# Patient Record
Sex: Male | Born: 1960
Health system: Southern US, Community
[De-identification: ages and names within clinical notes are randomized; demographics above are authoritative.]

## PROBLEM LIST (undated history)

## (undated) DIAGNOSIS — N189 Chronic kidney disease, unspecified: Secondary | ICD-10-CM

## (undated) DIAGNOSIS — E119 Type 2 diabetes mellitus without complications: Secondary | ICD-10-CM

## (undated) DIAGNOSIS — I10 Essential (primary) hypertension: Secondary | ICD-10-CM

## (undated) HISTORY — PX: NO PAST SURGERIES: SHX2092

## (undated) HISTORY — PX: HERNIA REPAIR: SHX51

---

## 2005-12-15 ENCOUNTER — Ambulatory Visit: Payer: Self-pay | Admitting: Internal Medicine

## 2013-01-06 ENCOUNTER — Other Ambulatory Visit: Payer: Self-pay | Admitting: Internal Medicine

## 2013-01-06 DIAGNOSIS — R894 Abnormal immunological findings in specimens from other organs, systems and tissues: Secondary | ICD-10-CM

## 2013-01-06 DIAGNOSIS — R519 Headache, unspecified: Secondary | ICD-10-CM

## 2013-01-06 DIAGNOSIS — E236 Other disorders of pituitary gland: Secondary | ICD-10-CM

## 2013-01-12 ENCOUNTER — Ambulatory Visit
Admission: RE | Admit: 2013-01-12 | Discharge: 2013-01-12 | Disposition: A | Discharge status: Home / Self Care | Payer: BC Managed Care – PPO | Source: Ambulatory Visit | Attending: Internal Medicine | Admitting: Internal Medicine

## 2013-01-12 DIAGNOSIS — R519 Headache, unspecified: Secondary | ICD-10-CM

## 2013-01-12 DIAGNOSIS — E236 Other disorders of pituitary gland: Secondary | ICD-10-CM

## 2013-01-12 DIAGNOSIS — R894 Abnormal immunological findings in specimens from other organs, systems and tissues: Secondary | ICD-10-CM

## 2013-01-12 MED ORDER — GADOBENATE DIMEGLUMINE 529 MG/ML IV SOLN
15.0000 mL | Freq: Once | INTRAVENOUS | Status: AC | PRN
  Administered 2013-01-12: 15 mL via INTRAVENOUS

## 2015-05-17 ENCOUNTER — Other Ambulatory Visit: Payer: Self-pay | Admitting: Gastroenterology

## 2015-06-09 ENCOUNTER — Other Ambulatory Visit (HOSPITAL_COMMUNITY): Payer: Self-pay | Admitting: Internal Medicine

## 2015-06-09 DIAGNOSIS — R0602 Shortness of breath: Secondary | ICD-10-CM

## 2015-06-10 ENCOUNTER — Ambulatory Visit (HOSPITAL_COMMUNITY): Payer: No Typology Code available for payment source | Attending: Cardiovascular Disease

## 2015-06-10 ENCOUNTER — Other Ambulatory Visit: Payer: Self-pay

## 2015-06-10 DIAGNOSIS — R0602 Shortness of breath: Secondary | ICD-10-CM

## 2015-06-10 DIAGNOSIS — E785 Hyperlipidemia, unspecified: Secondary | ICD-10-CM | POA: Diagnosis not present

## 2015-06-10 DIAGNOSIS — I119 Hypertensive heart disease without heart failure: Secondary | ICD-10-CM | POA: Insufficient documentation

## 2015-06-10 DIAGNOSIS — Z8249 Family history of ischemic heart disease and other diseases of the circulatory system: Secondary | ICD-10-CM | POA: Diagnosis not present

## 2015-06-16 ENCOUNTER — Other Ambulatory Visit: Payer: Self-pay | Admitting: Nephrology

## 2015-06-16 DIAGNOSIS — R809 Proteinuria, unspecified: Secondary | ICD-10-CM

## 2015-06-17 ENCOUNTER — Other Ambulatory Visit (HOSPITAL_COMMUNITY): Payer: Self-pay | Admitting: Nephrology

## 2015-06-17 DIAGNOSIS — R809 Proteinuria, unspecified: Secondary | ICD-10-CM

## 2015-06-21 ENCOUNTER — Ambulatory Visit (HOSPITAL_COMMUNITY): Admission: RE | Admit: 2015-06-21 | Payer: Self-pay | Source: Ambulatory Visit | Admitting: Gastroenterology

## 2015-06-21 ENCOUNTER — Ambulatory Visit
Admission: RE | Admit: 2015-06-21 | Discharge: 2015-06-21 | Disposition: A | Discharge status: Home / Self Care | Payer: No Typology Code available for payment source | Source: Ambulatory Visit | Attending: Nephrology | Admitting: Nephrology

## 2015-06-21 ENCOUNTER — Encounter (HOSPITAL_COMMUNITY): Admission: RE | Payer: Self-pay | Source: Ambulatory Visit

## 2015-06-21 DIAGNOSIS — R809 Proteinuria, unspecified: Secondary | ICD-10-CM

## 2015-06-21 SURGERY — COLONOSCOPY WITH PROPOFOL
Anesthesia: Monitor Anesthesia Care

## 2015-06-25 ENCOUNTER — Other Ambulatory Visit: Payer: Self-pay | Admitting: Radiology

## 2015-06-25 ENCOUNTER — Other Ambulatory Visit: Payer: Self-pay | Admitting: General Surgery

## 2015-06-28 ENCOUNTER — Ambulatory Visit (HOSPITAL_COMMUNITY)
Admission: RE | Admit: 2015-06-28 | Discharge: 2015-06-28 | Disposition: A | Discharge status: Home / Self Care | Payer: No Typology Code available for payment source | Source: Ambulatory Visit | Attending: Nephrology | Admitting: Nephrology

## 2015-06-28 ENCOUNTER — Encounter (HOSPITAL_COMMUNITY): Payer: Self-pay

## 2015-06-28 DIAGNOSIS — R809 Proteinuria, unspecified: Secondary | ICD-10-CM | POA: Diagnosis not present

## 2015-06-28 HISTORY — DX: Chronic kidney disease, unspecified: N18.9

## 2015-06-28 HISTORY — DX: Essential (primary) hypertension: I10

## 2015-06-28 LAB — CBC
HEMATOCRIT: 44.3 % (ref 39.0–52.0)
HEMOGLOBIN: 15.4 g/dL (ref 13.0–17.0)
MCH: 31.8 pg (ref 26.0–34.0)
MCHC: 34.8 g/dL (ref 30.0–36.0)
MCV: 91.5 fL (ref 78.0–100.0)
Platelets: 198 10*3/uL (ref 150–400)
RBC: 4.84 MIL/uL (ref 4.22–5.81)
RDW: 13.1 % (ref 11.5–15.5)
WBC: 5.1 10*3/uL (ref 4.0–10.5)

## 2015-06-28 LAB — APTT: aPTT: 29 seconds (ref 24–37)

## 2015-06-28 LAB — PROTIME-INR
INR: 0.96 (ref 0.00–1.49)
Prothrombin Time: 13 seconds (ref 11.6–15.2)

## 2015-06-28 LAB — GLUCOSE, CAPILLARY: Glucose-Capillary: 115 mg/dL — ABNORMAL HIGH (ref 65–99)

## 2015-06-28 MED ORDER — FENTANYL CITRATE (PF) 100 MCG/2ML IJ SOLN
INTRAMUSCULAR | Status: AC | PRN
  Administered 2015-06-28: 25 ug via INTRAVENOUS
  Administered 2015-06-28: 50 ug via INTRAVENOUS

## 2015-06-28 MED ORDER — MIDAZOLAM HCL 2 MG/2ML IJ SOLN
INTRAMUSCULAR | Status: AC | PRN
  Administered 2015-06-28: 0.5 mg via INTRAVENOUS
  Administered 2015-06-28: 1 mg via INTRAVENOUS

## 2015-06-28 MED ORDER — LIDOCAINE HCL (PF) 1 % IJ SOLN
INTRAMUSCULAR | Status: AC
  Filled 2015-06-28: qty 10

## 2015-06-28 MED ORDER — SODIUM CHLORIDE 0.9 % IV SOLN
INTRAVENOUS | Status: AC | PRN
  Administered 2015-06-28: 10 mL/h via INTRAVENOUS

## 2015-06-28 MED ORDER — MIDAZOLAM HCL 2 MG/2ML IJ SOLN
INTRAMUSCULAR | Status: AC
  Filled 2015-06-28: qty 2

## 2015-06-28 MED ORDER — GELATIN ABSORBABLE 12-7 MM EX MISC
CUTANEOUS | Status: AC
  Filled 2015-06-28: qty 1

## 2015-06-28 MED ORDER — FENTANYL CITRATE (PF) 100 MCG/2ML IJ SOLN
INTRAMUSCULAR | Status: AC
  Filled 2015-06-28: qty 2

## 2015-06-28 MED ORDER — SODIUM CHLORIDE 0.9 % IV SOLN: Freq: Once | INTRAVENOUS | Status: DC

## 2015-06-28 NOTE — Procedures (Signed)
sucessful LT RENAL RANDOM CORE BX No comp Stable path pending Full report in PACS

## 2015-06-28 NOTE — Discharge Instructions (Signed)
Kidney Biopsy, Care After °Refer to this sheet in the next few weeks. These instructions provide you with information on caring for yourself after your procedure. Your health care provider may also give you more specific instructions. Your treatment has been planned according to current medical practices, but problems sometimes occur. Call your health care provider if you have any problems or questions after your procedure.  °WHAT TO EXPECT AFTER THE PROCEDURE  °· You may notice blood in the urine for the first 24 hours after the biopsy. °· You may feel some pain at the biopsy site for 1-2 weeks after the biopsy. °HOME CARE INSTRUCTIONS °· Do not lift anything heavier than 10 lb (4.5 kg) for 2 weeks. °· Do not take any non-steroidal anti-inflammatory drugs (NSAIDs) or any blood thinners for a week after the biopsy unless instructed to do so by your health care provider. °· Only take medicines for pain, fever, or discomfort as directed by your health care provider. °SEEK MEDICAL CARE IF: °· You have bloody urine more than 24 hours after the biopsy.   °· You develop a fever.   °· You cannot urinate.   °· You have increasing pain at the biopsy site.   °SEEK IMMEDIATE MEDICAL CARE IF: °You feel faint or dizzy.  °  °This information is not intended to replace advice given to you by your health care provider. Make sure you discuss any questions you have with your health care provider. °  °Document Released: 10/30/2012 Document Reviewed: 10/30/2012 °Elsevier Interactive Patient Education ©2016 [employer] ° °

## 2015-06-28 NOTE — H&P (Signed)
Chief Complaint: proteinuria and CKD  Referring Physician:Dr. Zetta BillsJay Patel  Supervising Physician: Ruel FavorsShick, Trevor  HPI: Greg Johnston is an 55 y.o. male who for the last month has had significant fluid retention.  He was noted to have CKD with proteinuria.  He was having difficulty urinating without the help of lasix.  His albumin has been noted to be low at 1.6 as well.  A request has been made for a random renal biopsy to help determine a cause for this problem.  He presents today for this procedure.  Past Medical History:  Past Medical History  Diagnosis Date  . Hypertension   . Chronic kidney disease     Past Surgical History:  Past Surgical History  Procedure Laterality Date  . No past surgeries      Family History:  Family History  Problem Relation Age of Onset  . Atrial fibrillation Mother   . Congestive Heart Failure Mother   . Hypertension Mother   . Melanoma Father   . Hypertension Father   . Diabetes Father   . Testicular cancer Brother     Social History:  reports that he quit smoking about 22 years ago. His smoking use included Cigarettes. He has a 30 pack-year smoking history. He does not have any smokeless tobacco history on file. He reports that he does not drink alcohol or use illicit drugs.  Allergies: No Known Allergies  Medications:   Medication List    ASK your doctor about these medications        diazepam 5 MG tablet  Commonly known as:  VALIUM  Take one tablet nightly as needed for anxiety or sleep.     furosemide 40 MG tablet  Commonly known as:  LASIX  Take 40 mg by mouth 2 (two) times daily.     indomethacin 50 MG capsule  Commonly known as:  INDOCIN  Take one capsule by mouth twice daily as needeed for gout.     ketoconazole 2 % shampoo  Commonly known as:  NIZORAL  APP EXT 2 TIMES A WEEK.     lisinopril 10 MG tablet  Commonly known as:  PRINIVIL,ZESTRIL  Take 20 mg by mouth at bedtime.     polyethylene glycol-electrolytes 420 g  solution  Commonly known as:  NuLYTELY/GoLYTELY  TK UTD PER SHEET INSTRUCTIONS FOR 1 DAY     testosterone 50 MG/5GM (1%) Gel  Commonly known as:  ANDROGEL  APP 2 APPLICATIONS EXTERNALLY TO AFFECTED  AREA EVERY TWO DAYS.        Please HPI for pertinent positives, otherwise complete 10 system ROS negative.  Mallampati Score: MD Evaluation Airway: WNL Heart: WNL Abdomen: WNL Chest/ Lungs: WNL ASA  Classification: 2 Mallampati/Airway Score: One  Physical Exam: BP 122/87 mmHg  Pulse 66  Temp(Src) 98.1 F (36.7 C) (Oral)  Resp 20  Ht 6\' 2"  (1.88 m)  Wt 260 lb (117.935 kg)  BMI 33.37 kg/m2  SpO2 93% Body mass index is 33.37 kg/(m^2). General: pleasant, WD, WN white male who is laying in bed in NAD HEENT: head is normocephalic, atraumatic.  Sclera are noninjected.  PERRL.  Ears and nose without any masses or lesions.  Mouth is pink and moist Heart: regular, rate, and rhythm.  Normal s1,s2. No obvious murmurs, gallops, or rubs noted.  Palpable radial and pedal pulses bilaterally Lungs: CTAB, no wheezes, rhonchi, or rales noted.  Respiratory effort nonlabored Abd: soft, NT, ND, +BS, no masses, hernias, or organomegaly MS: all  4 extremities are symmetrical with no cyanosis, clubbing.  +bilateral LE edema (+1) Skin: warm and dry with no masses, lesions, or rashes Psych: A&Ox3 with an appropriate affect.   Labs: Results for orders placed or performed during the hospital encounter of 06/28/15 (from the past 48 hour(s))  APTT upon arrival     Status: None   Collection Time: 06/28/15  6:30 AM  Result Value Ref Range   aPTT 29 24 - 37 seconds  CBC upon arrival     Status: None   Collection Time: 06/28/15  6:30 AM  Result Value Ref Range   WBC 5.1 4.0 - 10.5 K/uL   RBC 4.84 4.22 - 5.81 MIL/uL   Hemoglobin 15.4 13.0 - 17.0 g/dL   HCT 40.9 81.1 - 91.4 %   MCV 91.5 78.0 - 100.0 fL   MCH 31.8 26.0 - 34.0 pg   MCHC 34.8 30.0 - 36.0 g/dL   RDW 78.2 95.6 - 21.3 %   Platelets 198 150  - 400 K/uL  Protime-INR upon arrival     Status: None   Collection Time: 06/28/15  6:30 AM  Result Value Ref Range   Prothrombin Time 13.0 11.6 - 15.2 seconds   INR 0.96 0.00 - 1.49  Glucose, capillary     Status: Abnormal   Collection Time: 06/28/15  6:38 AM  Result Value Ref Range   Glucose-Capillary 115 (H) 65 - 99 mg/dL    Imaging: No results found.  Assessment/Plan 1. Proteinuria with CKD -labs and vitals have been reviewed and are normal -will proceed with random renal biopsy -Risks and Benefits discussed with the patient including, but not limited to bleeding, infection, damage to adjacent structures or low yield requiring additional tests. All of the patient's questions were answered, patient is agreeable to proceed. Consent signed and in chart.   Thank you for this interesting consult.  I greatly enjoyed meeting Greg Johnston and look forward to participating in their care.  A copy of this report was sent to the requesting provider on this date.  Electronically Signed: Letha Cape 06/28/2015, 7:54 AM   I spent a total of  30 Minutes   in face to face in clinical consultation, greater than 50% of which was counseling/coordinating care for proteinuria

## 2015-07-06 ENCOUNTER — Other Ambulatory Visit: Payer: Self-pay | Admitting: Nephrology

## 2015-07-06 ENCOUNTER — Ambulatory Visit
Admission: RE | Admit: 2015-07-06 | Discharge: 2015-07-06 | Disposition: A | Discharge status: Home / Self Care | Payer: No Typology Code available for payment source | Source: Ambulatory Visit | Attending: Nephrology | Admitting: Nephrology

## 2015-07-06 DIAGNOSIS — R918 Other nonspecific abnormal finding of lung field: Secondary | ICD-10-CM

## 2015-09-03 ENCOUNTER — Other Ambulatory Visit: Payer: Self-pay | Admitting: Nephrology

## 2015-09-03 ENCOUNTER — Ambulatory Visit
Admission: RE | Admit: 2015-09-03 | Discharge: 2015-09-03 | Disposition: A | Discharge status: Home / Self Care | Payer: No Typology Code available for payment source | Source: Ambulatory Visit | Attending: Nephrology | Admitting: Nephrology

## 2015-09-03 DIAGNOSIS — N052 Unspecified nephritic syndrome with diffuse membranous glomerulonephritis: Secondary | ICD-10-CM

## 2015-09-10 ENCOUNTER — Encounter (INDEPENDENT_AMBULATORY_CARE_PROVIDER_SITE_OTHER): Payer: No Typology Code available for payment source | Admitting: Ophthalmology

## 2015-09-10 DIAGNOSIS — I10 Essential (primary) hypertension: Secondary | ICD-10-CM | POA: Diagnosis not present

## 2015-09-10 DIAGNOSIS — H35033 Hypertensive retinopathy, bilateral: Secondary | ICD-10-CM

## 2015-09-10 DIAGNOSIS — H2513 Age-related nuclear cataract, bilateral: Secondary | ICD-10-CM | POA: Diagnosis not present

## 2015-09-10 DIAGNOSIS — H43813 Vitreous degeneration, bilateral: Secondary | ICD-10-CM

## 2015-10-12 ENCOUNTER — Other Ambulatory Visit: Payer: Self-pay | Admitting: Nephrology

## 2015-10-12 ENCOUNTER — Ambulatory Visit
Admission: RE | Admit: 2015-10-12 | Discharge: 2015-10-12 | Disposition: A | Discharge status: Home / Self Care | Payer: No Typology Code available for payment source | Source: Ambulatory Visit | Attending: Nephrology | Admitting: Nephrology

## 2015-10-12 DIAGNOSIS — R109 Unspecified abdominal pain: Secondary | ICD-10-CM

## 2015-10-14 ENCOUNTER — Other Ambulatory Visit: Payer: Self-pay | Admitting: Nurse Practitioner

## 2015-10-14 ENCOUNTER — Ambulatory Visit
Admission: RE | Admit: 2015-10-14 | Discharge: 2015-10-14 | Disposition: A | Discharge status: Home / Self Care | Payer: No Typology Code available for payment source | Source: Ambulatory Visit | Attending: Nurse Practitioner | Admitting: Nurse Practitioner

## 2015-10-14 DIAGNOSIS — M25552 Pain in left hip: Secondary | ICD-10-CM

## 2015-11-10 ENCOUNTER — Encounter (INDEPENDENT_AMBULATORY_CARE_PROVIDER_SITE_OTHER): Payer: No Typology Code available for payment source | Admitting: Ophthalmology

## 2015-11-10 DIAGNOSIS — H35033 Hypertensive retinopathy, bilateral: Secondary | ICD-10-CM

## 2015-11-10 DIAGNOSIS — H2513 Age-related nuclear cataract, bilateral: Secondary | ICD-10-CM | POA: Diagnosis not present

## 2015-11-10 DIAGNOSIS — H43813 Vitreous degeneration, bilateral: Secondary | ICD-10-CM

## 2015-11-10 DIAGNOSIS — I10 Essential (primary) hypertension: Secondary | ICD-10-CM

## 2016-05-15 DIAGNOSIS — E291 Testicular hypofunction: Secondary | ICD-10-CM | POA: Diagnosis not present

## 2016-05-26 DIAGNOSIS — N052 Unspecified nephritic syndrome with diffuse membranous glomerulonephritis: Secondary | ICD-10-CM | POA: Diagnosis not present

## 2016-05-26 DIAGNOSIS — E559 Vitamin D deficiency, unspecified: Secondary | ICD-10-CM | POA: Diagnosis not present

## 2016-05-26 DIAGNOSIS — Z79899 Other long term (current) drug therapy: Secondary | ICD-10-CM | POA: Diagnosis not present

## 2016-05-30 DIAGNOSIS — E669 Obesity, unspecified: Secondary | ICD-10-CM | POA: Diagnosis not present

## 2016-05-30 DIAGNOSIS — I129 Hypertensive chronic kidney disease with stage 1 through stage 4 chronic kidney disease, or unspecified chronic kidney disease: Secondary | ICD-10-CM | POA: Diagnosis not present

## 2016-05-30 DIAGNOSIS — N052 Unspecified nephritic syndrome with diffuse membranous glomerulonephritis: Secondary | ICD-10-CM | POA: Diagnosis not present

## 2016-05-30 DIAGNOSIS — E291 Testicular hypofunction: Secondary | ICD-10-CM | POA: Diagnosis not present

## 2016-05-30 DIAGNOSIS — E559 Vitamin D deficiency, unspecified: Secondary | ICD-10-CM | POA: Diagnosis not present

## 2016-06-13 DIAGNOSIS — E291 Testicular hypofunction: Secondary | ICD-10-CM | POA: Diagnosis not present

## 2016-06-14 ENCOUNTER — Other Ambulatory Visit: Payer: Self-pay | Admitting: Internal Medicine

## 2016-06-14 ENCOUNTER — Ambulatory Visit
Admission: RE | Admit: 2016-06-14 | Discharge: 2016-06-14 | Disposition: A | Discharge status: Home / Self Care | Payer: BLUE CROSS/BLUE SHIELD | Source: Ambulatory Visit | Attending: Internal Medicine | Admitting: Internal Medicine

## 2016-06-14 DIAGNOSIS — R05 Cough: Secondary | ICD-10-CM | POA: Diagnosis not present

## 2016-06-14 DIAGNOSIS — Z9225 Personal history of immunosupression therapy: Secondary | ICD-10-CM | POA: Diagnosis not present

## 2016-06-14 DIAGNOSIS — J019 Acute sinusitis, unspecified: Secondary | ICD-10-CM | POA: Diagnosis not present

## 2016-06-14 DIAGNOSIS — J209 Acute bronchitis, unspecified: Secondary | ICD-10-CM | POA: Diagnosis not present

## 2016-07-03 DIAGNOSIS — E291 Testicular hypofunction: Secondary | ICD-10-CM | POA: Diagnosis not present

## 2016-07-11 DIAGNOSIS — E291 Testicular hypofunction: Secondary | ICD-10-CM | POA: Diagnosis not present

## 2016-07-11 DIAGNOSIS — Z1389 Encounter for screening for other disorder: Secondary | ICD-10-CM | POA: Diagnosis not present

## 2016-07-11 DIAGNOSIS — M109 Gout, unspecified: Secondary | ICD-10-CM | POA: Diagnosis not present

## 2016-07-11 DIAGNOSIS — E119 Type 2 diabetes mellitus without complications: Secondary | ICD-10-CM | POA: Diagnosis not present

## 2016-07-11 DIAGNOSIS — Z0000 Encounter for general adult medical examination without abnormal findings: Secondary | ICD-10-CM | POA: Diagnosis not present

## 2016-07-18 DIAGNOSIS — E291 Testicular hypofunction: Secondary | ICD-10-CM | POA: Diagnosis not present

## 2016-08-01 DIAGNOSIS — E291 Testicular hypofunction: Secondary | ICD-10-CM | POA: Diagnosis not present

## 2016-08-16 DIAGNOSIS — E291 Testicular hypofunction: Secondary | ICD-10-CM | POA: Diagnosis not present

## 2016-09-08 DIAGNOSIS — E291 Testicular hypofunction: Secondary | ICD-10-CM | POA: Diagnosis not present

## 2016-09-12 DIAGNOSIS — N052 Unspecified nephritic syndrome with diffuse membranous glomerulonephritis: Secondary | ICD-10-CM | POA: Diagnosis not present

## 2016-09-12 DIAGNOSIS — E559 Vitamin D deficiency, unspecified: Secondary | ICD-10-CM | POA: Diagnosis not present

## 2016-09-19 DIAGNOSIS — E1129 Type 2 diabetes mellitus with other diabetic kidney complication: Secondary | ICD-10-CM | POA: Diagnosis not present

## 2016-09-19 DIAGNOSIS — N052 Unspecified nephritic syndrome with diffuse membranous glomerulonephritis: Secondary | ICD-10-CM | POA: Diagnosis not present

## 2016-09-19 DIAGNOSIS — I129 Hypertensive chronic kidney disease with stage 1 through stage 4 chronic kidney disease, or unspecified chronic kidney disease: Secondary | ICD-10-CM | POA: Diagnosis not present

## 2016-09-19 DIAGNOSIS — E559 Vitamin D deficiency, unspecified: Secondary | ICD-10-CM | POA: Diagnosis not present

## 2016-09-25 DIAGNOSIS — E291 Testicular hypofunction: Secondary | ICD-10-CM | POA: Diagnosis not present

## 2016-10-09 DIAGNOSIS — E291 Testicular hypofunction: Secondary | ICD-10-CM | POA: Diagnosis not present

## 2016-10-20 DIAGNOSIS — I10 Essential (primary) hypertension: Secondary | ICD-10-CM | POA: Diagnosis not present

## 2016-10-20 DIAGNOSIS — E291 Testicular hypofunction: Secondary | ICD-10-CM | POA: Diagnosis not present

## 2016-10-20 DIAGNOSIS — E1165 Type 2 diabetes mellitus with hyperglycemia: Secondary | ICD-10-CM | POA: Diagnosis not present

## 2016-10-20 DIAGNOSIS — E119 Type 2 diabetes mellitus without complications: Secondary | ICD-10-CM | POA: Diagnosis not present

## 2016-11-07 DIAGNOSIS — E291 Testicular hypofunction: Secondary | ICD-10-CM | POA: Diagnosis not present

## 2016-11-22 DIAGNOSIS — E291 Testicular hypofunction: Secondary | ICD-10-CM | POA: Diagnosis not present

## 2016-11-27 DIAGNOSIS — E119 Type 2 diabetes mellitus without complications: Secondary | ICD-10-CM | POA: Diagnosis not present

## 2016-12-11 DIAGNOSIS — E291 Testicular hypofunction: Secondary | ICD-10-CM | POA: Diagnosis not present

## 2016-12-25 DIAGNOSIS — E291 Testicular hypofunction: Secondary | ICD-10-CM | POA: Diagnosis not present

## 2016-12-31 IMAGING — CR DG PELVIS 1-2V
1 series · 1 of 1 positions shown · non-contrast
Comparison: CT scan of October 12, 2015.

CLINICAL DATA: Acute left hip pain without known injury.

EXAM:
PELVIS - 1-2 VIEW

[w pelvis *]
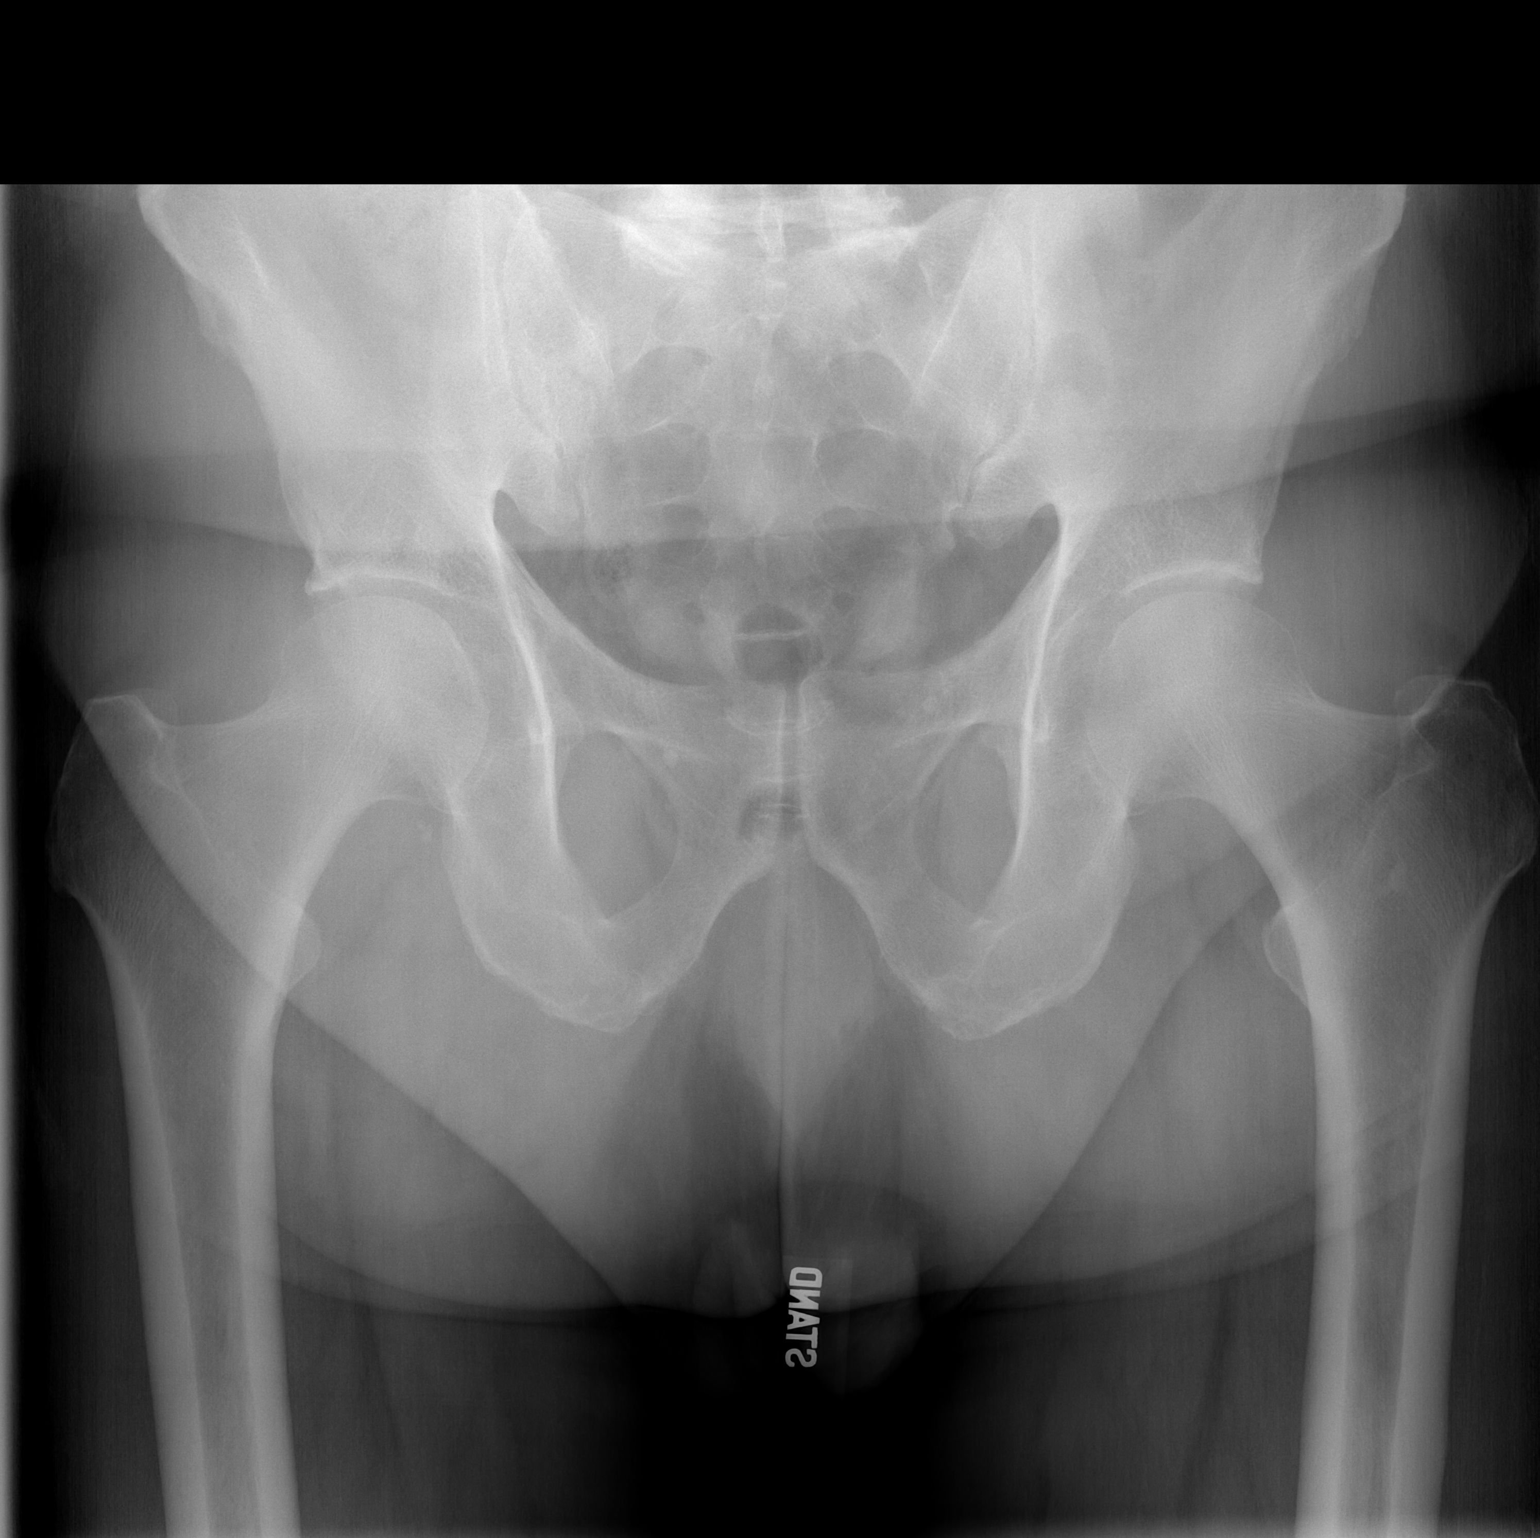

[1 of 1 positions shown; findings below may reference images not displayed]

FINDINGS: There is no evidence of pelvic fracture or diastasis. No pelvic bone
lesions are seen. Bilateral sacroiliac and hip joints appear normal.
IMPRESSION: Normal pelvis.

## 2017-01-06 DIAGNOSIS — J069 Acute upper respiratory infection, unspecified: Secondary | ICD-10-CM | POA: Diagnosis not present

## 2017-01-08 DIAGNOSIS — E291 Testicular hypofunction: Secondary | ICD-10-CM | POA: Diagnosis not present

## 2017-01-22 DIAGNOSIS — E291 Testicular hypofunction: Secondary | ICD-10-CM | POA: Diagnosis not present

## 2017-02-05 DIAGNOSIS — E291 Testicular hypofunction: Secondary | ICD-10-CM | POA: Diagnosis not present

## 2017-02-21 DIAGNOSIS — E291 Testicular hypofunction: Secondary | ICD-10-CM | POA: Diagnosis not present

## 2017-03-09 DIAGNOSIS — E291 Testicular hypofunction: Secondary | ICD-10-CM | POA: Diagnosis not present

## 2017-03-23 DIAGNOSIS — E291 Testicular hypofunction: Secondary | ICD-10-CM | POA: Diagnosis not present

## 2017-03-30 DIAGNOSIS — N052 Unspecified nephritic syndrome with diffuse membranous glomerulonephritis: Secondary | ICD-10-CM | POA: Diagnosis not present

## 2017-03-30 DIAGNOSIS — I129 Hypertensive chronic kidney disease with stage 1 through stage 4 chronic kidney disease, or unspecified chronic kidney disease: Secondary | ICD-10-CM | POA: Diagnosis not present

## 2017-03-30 DIAGNOSIS — E559 Vitamin D deficiency, unspecified: Secondary | ICD-10-CM | POA: Diagnosis not present

## 2017-04-06 DIAGNOSIS — E291 Testicular hypofunction: Secondary | ICD-10-CM | POA: Diagnosis not present

## 2017-04-20 DIAGNOSIS — E291 Testicular hypofunction: Secondary | ICD-10-CM | POA: Diagnosis not present

## 2017-05-04 DIAGNOSIS — E291 Testicular hypofunction: Secondary | ICD-10-CM | POA: Diagnosis not present

## 2017-05-22 DIAGNOSIS — E291 Testicular hypofunction: Secondary | ICD-10-CM | POA: Diagnosis not present

## 2017-06-25 DIAGNOSIS — E291 Testicular hypofunction: Secondary | ICD-10-CM | POA: Diagnosis not present

## 2017-07-23 DIAGNOSIS — E291 Testicular hypofunction: Secondary | ICD-10-CM | POA: Diagnosis not present

## 2017-08-13 DIAGNOSIS — E291 Testicular hypofunction: Secondary | ICD-10-CM | POA: Diagnosis not present

## 2017-08-24 DIAGNOSIS — E291 Testicular hypofunction: Secondary | ICD-10-CM | POA: Diagnosis not present

## 2017-09-01 IMAGING — DX DG CHEST 2V
2 series · 2 of 2 positions shown · non-contrast
Comparison: 09/03/2015

CLINICAL DATA: Cough x2 weeks, fever x1 day, acute bronchitis

EXAM:
CHEST  2 VIEW

[dg chest 2 view (1 of 2)]
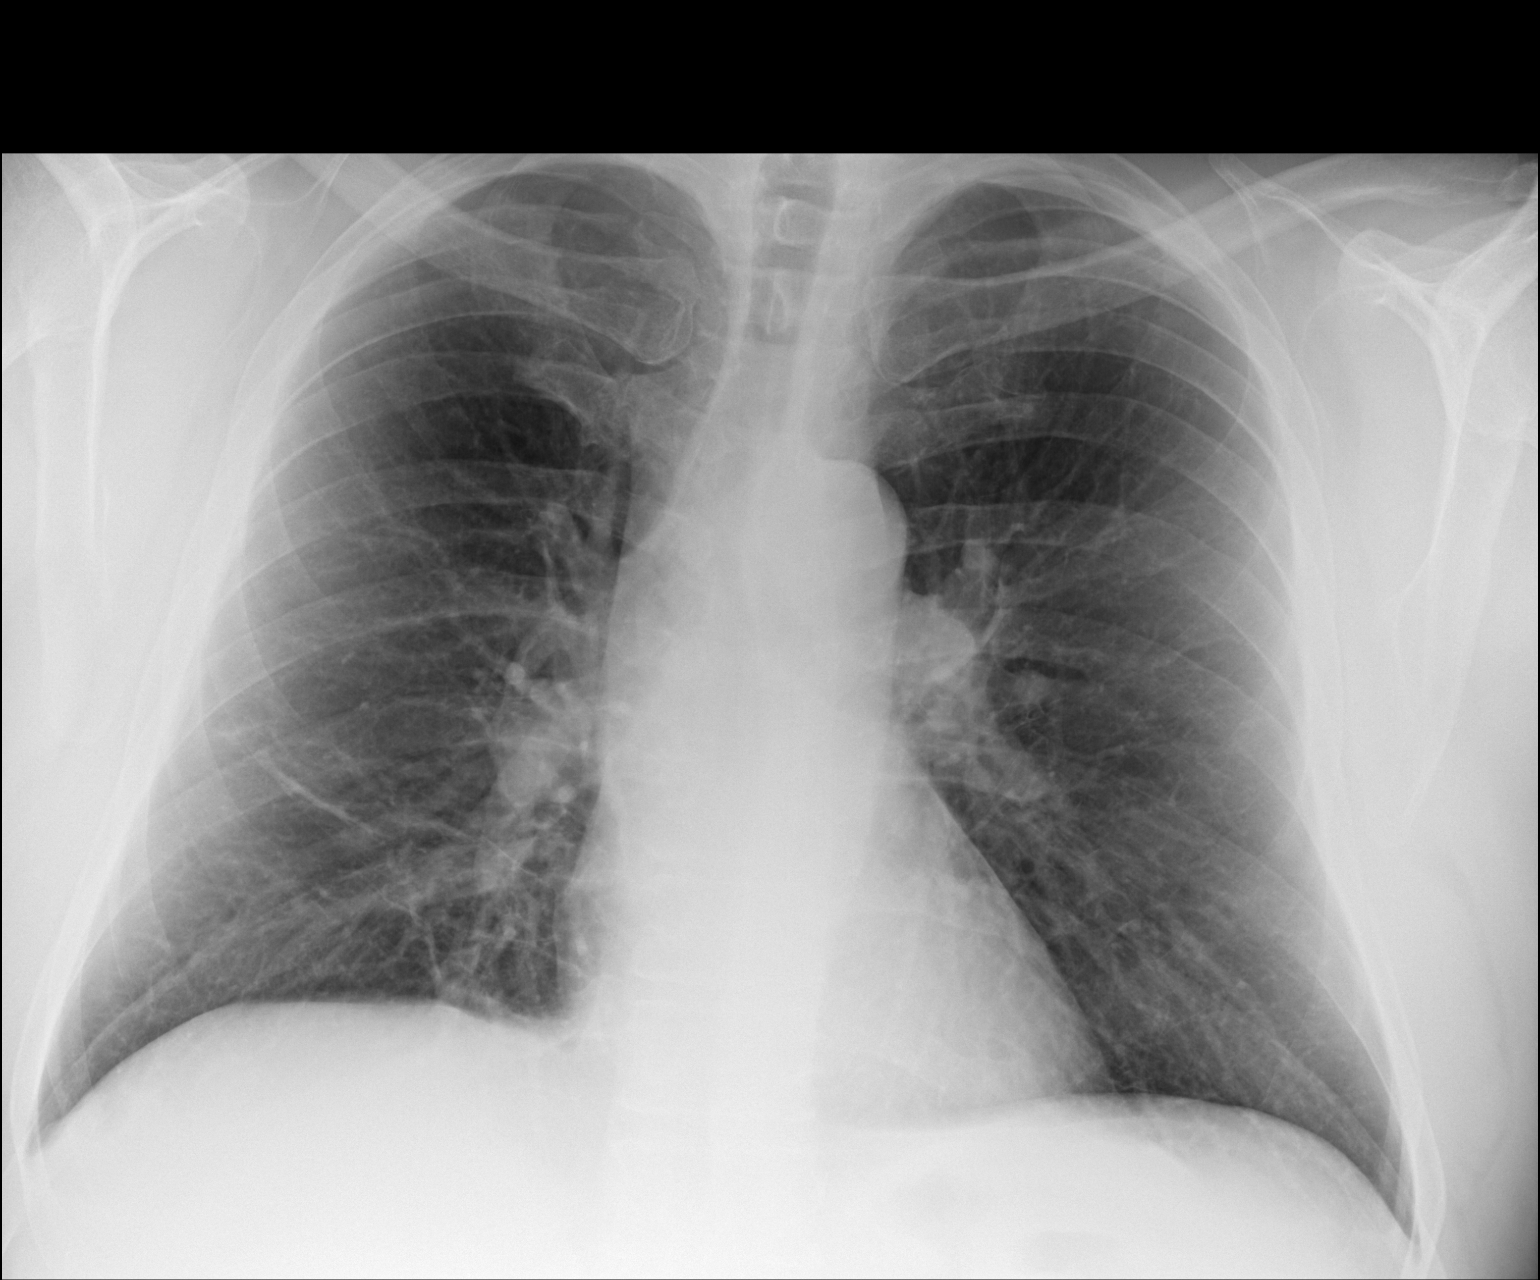

[dg chest 2 view (2 of 2)]
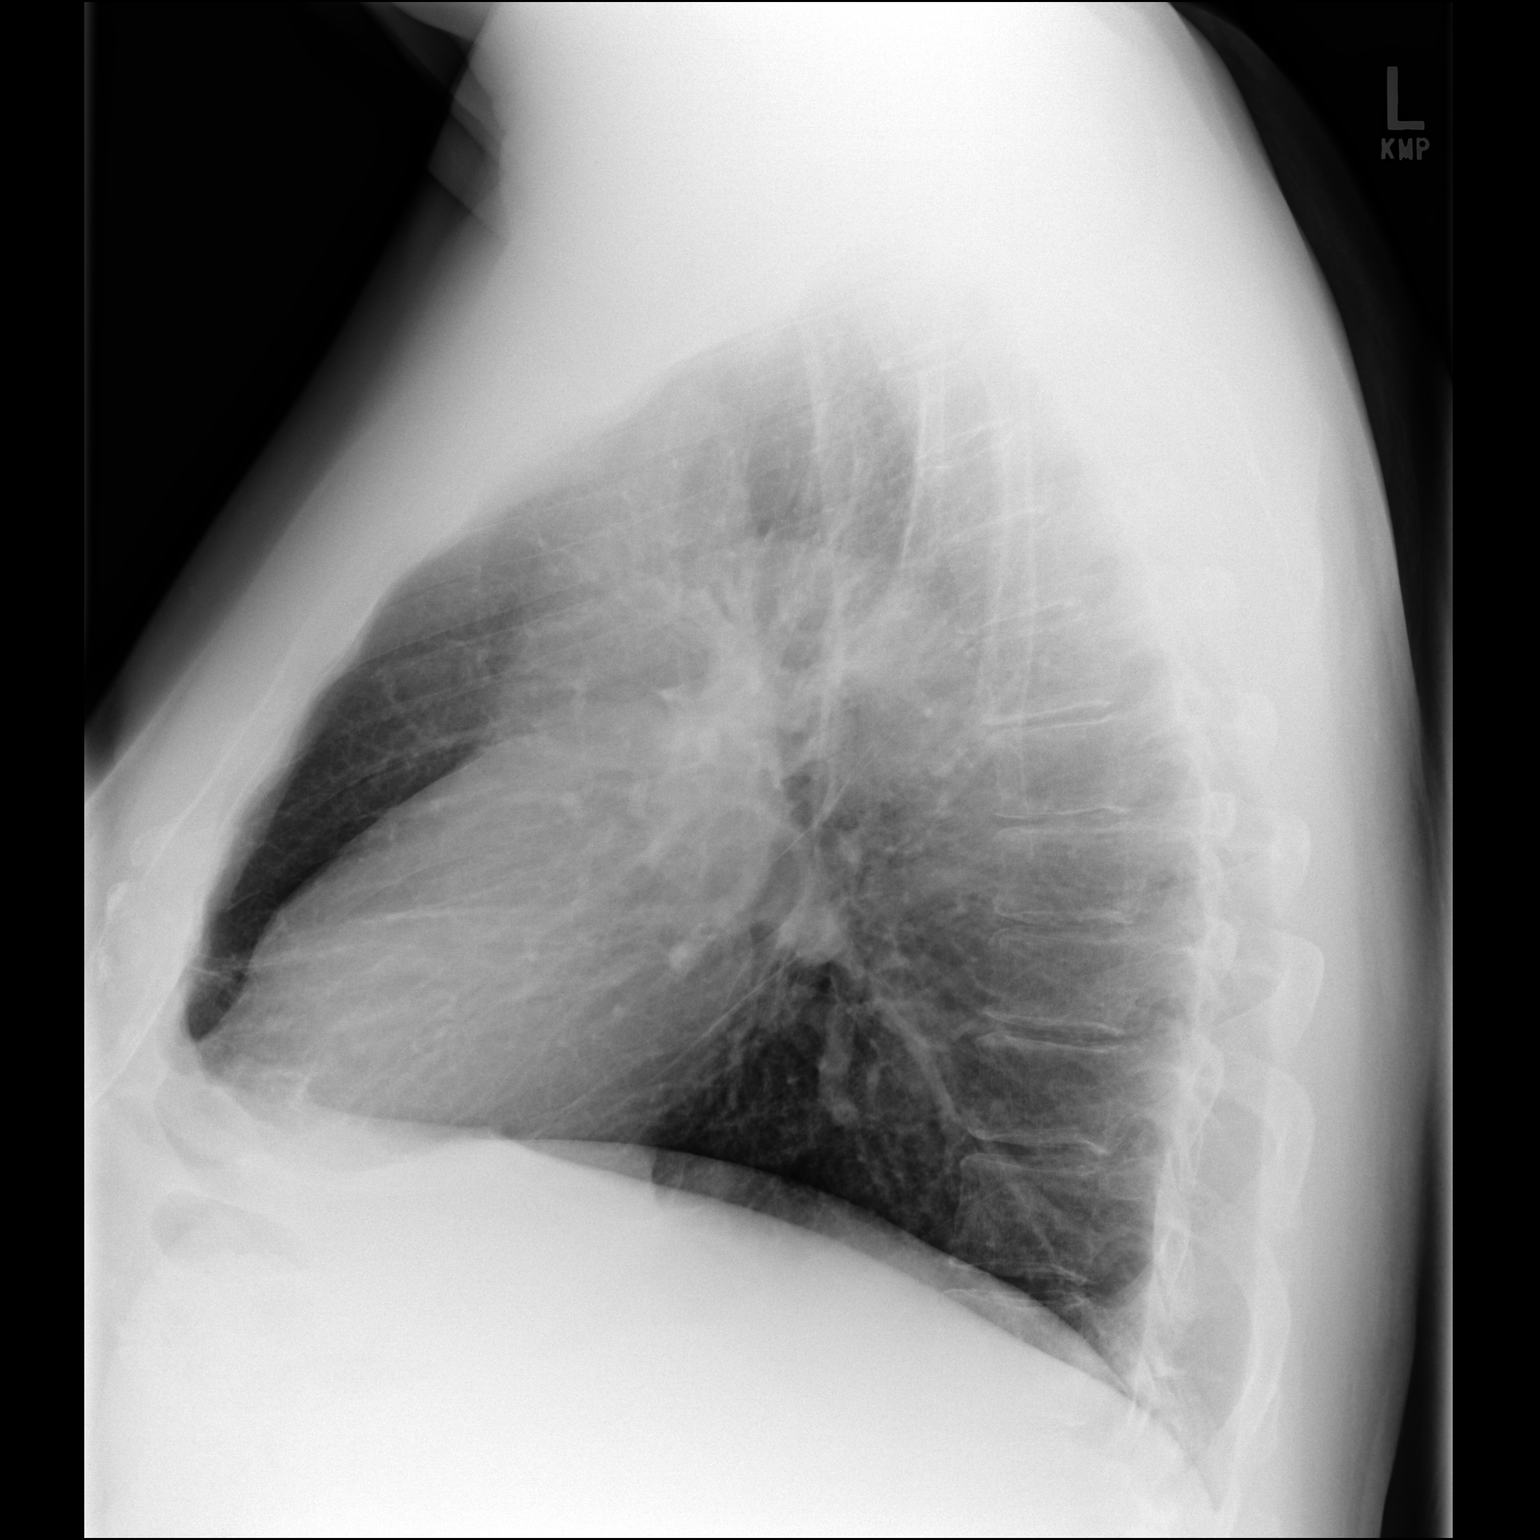

[2 of 2 positions shown; findings below may reference images not displayed]

FINDINGS: Lungs are clear.  No pleural effusion or pneumothorax.

The heart is normal in size.

Visualized osseous structures are within normal limits.
IMPRESSION: Normal chest radiographs.

## 2017-09-07 DIAGNOSIS — E291 Testicular hypofunction: Secondary | ICD-10-CM | POA: Diagnosis not present

## 2017-09-21 DIAGNOSIS — E291 Testicular hypofunction: Secondary | ICD-10-CM | POA: Diagnosis not present

## 2017-10-05 DIAGNOSIS — E291 Testicular hypofunction: Secondary | ICD-10-CM | POA: Diagnosis not present

## 2017-10-17 DIAGNOSIS — E291 Testicular hypofunction: Secondary | ICD-10-CM | POA: Diagnosis not present

## 2017-10-24 ENCOUNTER — Encounter (INDEPENDENT_AMBULATORY_CARE_PROVIDER_SITE_OTHER): Payer: BLUE CROSS/BLUE SHIELD | Admitting: Ophthalmology

## 2017-10-24 DIAGNOSIS — I10 Essential (primary) hypertension: Secondary | ICD-10-CM

## 2017-10-24 DIAGNOSIS — H43813 Vitreous degeneration, bilateral: Secondary | ICD-10-CM | POA: Diagnosis not present

## 2017-10-24 DIAGNOSIS — H35033 Hypertensive retinopathy, bilateral: Secondary | ICD-10-CM

## 2017-10-24 DIAGNOSIS — H2511 Age-related nuclear cataract, right eye: Secondary | ICD-10-CM | POA: Diagnosis not present

## 2017-11-02 DIAGNOSIS — E291 Testicular hypofunction: Secondary | ICD-10-CM | POA: Diagnosis not present

## 2017-11-05 DIAGNOSIS — R293 Abnormal posture: Secondary | ICD-10-CM | POA: Diagnosis not present

## 2017-11-05 DIAGNOSIS — M9903 Segmental and somatic dysfunction of lumbar region: Secondary | ICD-10-CM | POA: Diagnosis not present

## 2017-11-05 DIAGNOSIS — M5441 Lumbago with sciatica, right side: Secondary | ICD-10-CM | POA: Diagnosis not present

## 2017-11-05 DIAGNOSIS — M9905 Segmental and somatic dysfunction of pelvic region: Secondary | ICD-10-CM | POA: Diagnosis not present

## 2017-11-06 DIAGNOSIS — M5441 Lumbago with sciatica, right side: Secondary | ICD-10-CM | POA: Diagnosis not present

## 2017-11-06 DIAGNOSIS — M9905 Segmental and somatic dysfunction of pelvic region: Secondary | ICD-10-CM | POA: Diagnosis not present

## 2017-11-06 DIAGNOSIS — R293 Abnormal posture: Secondary | ICD-10-CM | POA: Diagnosis not present

## 2017-11-06 DIAGNOSIS — M9903 Segmental and somatic dysfunction of lumbar region: Secondary | ICD-10-CM | POA: Diagnosis not present

## 2017-11-14 DIAGNOSIS — R293 Abnormal posture: Secondary | ICD-10-CM | POA: Diagnosis not present

## 2017-11-14 DIAGNOSIS — M5441 Lumbago with sciatica, right side: Secondary | ICD-10-CM | POA: Diagnosis not present

## 2017-11-14 DIAGNOSIS — M9903 Segmental and somatic dysfunction of lumbar region: Secondary | ICD-10-CM | POA: Diagnosis not present

## 2017-11-14 DIAGNOSIS — M9905 Segmental and somatic dysfunction of pelvic region: Secondary | ICD-10-CM | POA: Diagnosis not present

## 2017-11-16 DIAGNOSIS — M9903 Segmental and somatic dysfunction of lumbar region: Secondary | ICD-10-CM | POA: Diagnosis not present

## 2017-11-16 DIAGNOSIS — M5441 Lumbago with sciatica, right side: Secondary | ICD-10-CM | POA: Diagnosis not present

## 2017-11-16 DIAGNOSIS — M9905 Segmental and somatic dysfunction of pelvic region: Secondary | ICD-10-CM | POA: Diagnosis not present

## 2017-11-16 DIAGNOSIS — R293 Abnormal posture: Secondary | ICD-10-CM | POA: Diagnosis not present

## 2017-11-26 DIAGNOSIS — R293 Abnormal posture: Secondary | ICD-10-CM | POA: Diagnosis not present

## 2017-11-26 DIAGNOSIS — M9903 Segmental and somatic dysfunction of lumbar region: Secondary | ICD-10-CM | POA: Diagnosis not present

## 2017-11-26 DIAGNOSIS — M9905 Segmental and somatic dysfunction of pelvic region: Secondary | ICD-10-CM | POA: Diagnosis not present

## 2017-11-26 DIAGNOSIS — M5441 Lumbago with sciatica, right side: Secondary | ICD-10-CM | POA: Diagnosis not present

## 2017-11-28 DIAGNOSIS — R293 Abnormal posture: Secondary | ICD-10-CM | POA: Diagnosis not present

## 2017-11-28 DIAGNOSIS — M9903 Segmental and somatic dysfunction of lumbar region: Secondary | ICD-10-CM | POA: Diagnosis not present

## 2017-11-28 DIAGNOSIS — M9905 Segmental and somatic dysfunction of pelvic region: Secondary | ICD-10-CM | POA: Diagnosis not present

## 2017-11-28 DIAGNOSIS — M5441 Lumbago with sciatica, right side: Secondary | ICD-10-CM | POA: Diagnosis not present

## 2017-12-10 DIAGNOSIS — Z23 Encounter for immunization: Secondary | ICD-10-CM | POA: Diagnosis not present

## 2017-12-10 DIAGNOSIS — E291 Testicular hypofunction: Secondary | ICD-10-CM | POA: Diagnosis not present

## 2017-12-21 DIAGNOSIS — E291 Testicular hypofunction: Secondary | ICD-10-CM | POA: Diagnosis not present

## 2018-01-04 DIAGNOSIS — E291 Testicular hypofunction: Secondary | ICD-10-CM | POA: Diagnosis not present

## 2018-01-16 DIAGNOSIS — F3289 Other specified depressive episodes: Secondary | ICD-10-CM | POA: Diagnosis not present

## 2018-01-17 DIAGNOSIS — M5441 Lumbago with sciatica, right side: Secondary | ICD-10-CM | POA: Diagnosis not present

## 2018-01-17 DIAGNOSIS — M9905 Segmental and somatic dysfunction of pelvic region: Secondary | ICD-10-CM | POA: Diagnosis not present

## 2018-01-17 DIAGNOSIS — R293 Abnormal posture: Secondary | ICD-10-CM | POA: Diagnosis not present

## 2018-01-17 DIAGNOSIS — M9903 Segmental and somatic dysfunction of lumbar region: Secondary | ICD-10-CM | POA: Diagnosis not present

## 2018-01-18 DIAGNOSIS — M9903 Segmental and somatic dysfunction of lumbar region: Secondary | ICD-10-CM | POA: Diagnosis not present

## 2018-01-18 DIAGNOSIS — R293 Abnormal posture: Secondary | ICD-10-CM | POA: Diagnosis not present

## 2018-01-18 DIAGNOSIS — E291 Testicular hypofunction: Secondary | ICD-10-CM | POA: Diagnosis not present

## 2018-01-18 DIAGNOSIS — M9905 Segmental and somatic dysfunction of pelvic region: Secondary | ICD-10-CM | POA: Diagnosis not present

## 2018-01-18 DIAGNOSIS — M5441 Lumbago with sciatica, right side: Secondary | ICD-10-CM | POA: Diagnosis not present

## 2018-01-22 DIAGNOSIS — M9905 Segmental and somatic dysfunction of pelvic region: Secondary | ICD-10-CM | POA: Diagnosis not present

## 2018-01-22 DIAGNOSIS — M5441 Lumbago with sciatica, right side: Secondary | ICD-10-CM | POA: Diagnosis not present

## 2018-01-22 DIAGNOSIS — R293 Abnormal posture: Secondary | ICD-10-CM | POA: Diagnosis not present

## 2018-01-22 DIAGNOSIS — M9903 Segmental and somatic dysfunction of lumbar region: Secondary | ICD-10-CM | POA: Diagnosis not present

## 2018-01-23 DIAGNOSIS — F3289 Other specified depressive episodes: Secondary | ICD-10-CM | POA: Diagnosis not present

## 2018-01-25 DIAGNOSIS — R293 Abnormal posture: Secondary | ICD-10-CM | POA: Diagnosis not present

## 2018-01-25 DIAGNOSIS — M5441 Lumbago with sciatica, right side: Secondary | ICD-10-CM | POA: Diagnosis not present

## 2018-01-25 DIAGNOSIS — M9903 Segmental and somatic dysfunction of lumbar region: Secondary | ICD-10-CM | POA: Diagnosis not present

## 2018-01-25 DIAGNOSIS — F3289 Other specified depressive episodes: Secondary | ICD-10-CM | POA: Diagnosis not present

## 2018-01-25 DIAGNOSIS — M9905 Segmental and somatic dysfunction of pelvic region: Secondary | ICD-10-CM | POA: Diagnosis not present

## 2018-01-28 DIAGNOSIS — M5441 Lumbago with sciatica, right side: Secondary | ICD-10-CM | POA: Diagnosis not present

## 2018-01-28 DIAGNOSIS — M9903 Segmental and somatic dysfunction of lumbar region: Secondary | ICD-10-CM | POA: Diagnosis not present

## 2018-01-28 DIAGNOSIS — M9905 Segmental and somatic dysfunction of pelvic region: Secondary | ICD-10-CM | POA: Diagnosis not present

## 2018-01-28 DIAGNOSIS — R293 Abnormal posture: Secondary | ICD-10-CM | POA: Diagnosis not present

## 2018-01-30 DIAGNOSIS — M9903 Segmental and somatic dysfunction of lumbar region: Secondary | ICD-10-CM | POA: Diagnosis not present

## 2018-01-30 DIAGNOSIS — M9905 Segmental and somatic dysfunction of pelvic region: Secondary | ICD-10-CM | POA: Diagnosis not present

## 2018-01-30 DIAGNOSIS — R293 Abnormal posture: Secondary | ICD-10-CM | POA: Diagnosis not present

## 2018-01-30 DIAGNOSIS — M5441 Lumbago with sciatica, right side: Secondary | ICD-10-CM | POA: Diagnosis not present

## 2018-02-01 DIAGNOSIS — M10071 Idiopathic gout, right ankle and foot: Secondary | ICD-10-CM | POA: Diagnosis not present

## 2018-02-15 DIAGNOSIS — E291 Testicular hypofunction: Secondary | ICD-10-CM | POA: Diagnosis not present

## 2018-03-01 DIAGNOSIS — E291 Testicular hypofunction: Secondary | ICD-10-CM | POA: Diagnosis not present

## 2018-03-18 DIAGNOSIS — E291 Testicular hypofunction: Secondary | ICD-10-CM | POA: Diagnosis not present

## 2018-04-01 DIAGNOSIS — Z1331 Encounter for screening for depression: Secondary | ICD-10-CM | POA: Diagnosis not present

## 2018-04-01 DIAGNOSIS — E1165 Type 2 diabetes mellitus with hyperglycemia: Secondary | ICD-10-CM | POA: Diagnosis not present

## 2018-04-01 DIAGNOSIS — R0989 Other specified symptoms and signs involving the circulatory and respiratory systems: Secondary | ICD-10-CM | POA: Diagnosis not present

## 2018-04-01 DIAGNOSIS — Z125 Encounter for screening for malignant neoplasm of prostate: Secondary | ICD-10-CM | POA: Diagnosis not present

## 2018-04-01 DIAGNOSIS — E1142 Type 2 diabetes mellitus with diabetic polyneuropathy: Secondary | ICD-10-CM | POA: Diagnosis not present

## 2018-04-01 DIAGNOSIS — E1149 Type 2 diabetes mellitus with other diabetic neurological complication: Secondary | ICD-10-CM | POA: Diagnosis not present

## 2018-04-01 DIAGNOSIS — E291 Testicular hypofunction: Secondary | ICD-10-CM | POA: Diagnosis not present

## 2018-04-01 DIAGNOSIS — N049 Nephrotic syndrome with unspecified morphologic changes: Secondary | ICD-10-CM | POA: Diagnosis not present

## 2018-04-04 DIAGNOSIS — I129 Hypertensive chronic kidney disease with stage 1 through stage 4 chronic kidney disease, or unspecified chronic kidney disease: Secondary | ICD-10-CM | POA: Diagnosis not present

## 2018-04-04 DIAGNOSIS — N052 Unspecified nephritic syndrome with diffuse membranous glomerulonephritis: Secondary | ICD-10-CM | POA: Diagnosis not present

## 2018-04-22 DIAGNOSIS — E291 Testicular hypofunction: Secondary | ICD-10-CM | POA: Diagnosis not present

## 2018-05-06 DIAGNOSIS — E291 Testicular hypofunction: Secondary | ICD-10-CM | POA: Diagnosis not present

## 2018-05-20 DIAGNOSIS — E291 Testicular hypofunction: Secondary | ICD-10-CM | POA: Diagnosis not present

## 2018-05-22 ENCOUNTER — Encounter (HOSPITAL_COMMUNITY): Payer: Self-pay

## 2018-05-22 ENCOUNTER — Other Ambulatory Visit: Payer: Self-pay

## 2018-05-22 ENCOUNTER — Emergency Department (HOSPITAL_COMMUNITY)
Admission: EM | Admit: 2018-05-22 | Discharge: 2018-05-22 | Disposition: A | Discharge status: Home / Self Care | Payer: BLUE CROSS/BLUE SHIELD | Attending: Emergency Medicine | Admitting: Emergency Medicine

## 2018-05-22 DIAGNOSIS — R42 Dizziness and giddiness: Secondary | ICD-10-CM | POA: Insufficient documentation

## 2018-05-22 DIAGNOSIS — Z79899 Other long term (current) drug therapy: Secondary | ICD-10-CM | POA: Diagnosis not present

## 2018-05-22 DIAGNOSIS — Z87891 Personal history of nicotine dependence: Secondary | ICD-10-CM | POA: Insufficient documentation

## 2018-05-22 DIAGNOSIS — I129 Hypertensive chronic kidney disease with stage 1 through stage 4 chronic kidney disease, or unspecified chronic kidney disease: Secondary | ICD-10-CM | POA: Diagnosis not present

## 2018-05-22 DIAGNOSIS — M5430 Sciatica, unspecified side: Secondary | ICD-10-CM | POA: Diagnosis not present

## 2018-05-22 DIAGNOSIS — N189 Chronic kidney disease, unspecified: Secondary | ICD-10-CM | POA: Insufficient documentation

## 2018-05-22 DIAGNOSIS — M545 Low back pain: Secondary | ICD-10-CM | POA: Diagnosis not present

## 2018-05-22 DIAGNOSIS — R112 Nausea with vomiting, unspecified: Secondary | ICD-10-CM | POA: Diagnosis not present

## 2018-05-22 LAB — BASIC METABOLIC PANEL
Anion gap: 13 (ref 5–15)
BUN: 13 mg/dL (ref 6–20)
CALCIUM: 9.6 mg/dL (ref 8.9–10.3)
CO2: 24 mmol/L (ref 22–32)
Chloride: 102 mmol/L (ref 98–111)
Creatinine, Ser: 1.09 mg/dL (ref 0.61–1.24)
GFR calc non Af Amer: >60 mL/min (ref >60)
Glucose, Bld: 203 mg/dL — ABNORMAL HIGH (ref 70–99)
Potassium: 4 mmol/L (ref 3.5–5.1)
Sodium: 139 mmol/L (ref 135–145)

## 2018-05-22 LAB — TROPONIN I: Troponin I: <0.03 ng/mL (ref <0.03)

## 2018-05-22 LAB — CBC WITH DIFFERENTIAL/PLATELET
Abs Immature Granulocytes: 0.07 10*3/uL (ref 0.00–0.07)
BASOS ABS: 0.1 10*3/uL (ref 0.0–0.1)
Basophils Relative: 1 %
Eosinophils Absolute: 0.3 10*3/uL (ref 0.0–0.5)
Eosinophils Relative: 4 %
HCT: 45.5 % (ref 39.0–52.0)
Hemoglobin: 15.5 g/dL (ref 13.0–17.0)
Immature Granulocytes: 1 %
LYMPHS ABS: 1 10*3/uL (ref 0.7–4.0)
Lymphocytes Relative: 14 %
MCH: 32.6 pg (ref 26.0–34.0)
MCHC: 34.1 g/dL (ref 30.0–36.0)
MCV: 95.6 fL (ref 80.0–100.0)
Monocytes Absolute: 0.6 10*3/uL (ref 0.1–1.0)
Monocytes Relative: 8 %
NRBC: 0 % (ref 0.0–0.2)
Neutro Abs: 5.2 10*3/uL (ref 1.7–7.7)
Neutrophils Relative %: 72 %
Platelets: 159 10*3/uL (ref 150–400)
RBC: 4.76 MIL/uL (ref 4.22–5.81)
RDW: 13.2 % (ref 11.5–15.5)
WBC: 7.1 10*3/uL (ref 4.0–10.5)

## 2018-05-22 MED ORDER — SODIUM CHLORIDE 0.9 % IV BOLUS
1000.0000 mL | Freq: Once | INTRAVENOUS | Status: AC
  Administered 2018-05-22: 1000 mL via INTRAVENOUS

## 2018-05-22 MED ORDER — IBUPROFEN 800 MG PO TABS
800.0000 mg | ORAL_TABLET | Freq: Once | ORAL | Status: AC
  Administered 2018-05-22: 800 mg via ORAL
  Filled 2018-05-22: qty 1

## 2018-05-22 MED ORDER — DIAZEPAM 5 MG/ML IJ SOLN
5.0000 mg | Freq: Once | INTRAMUSCULAR | Status: AC
  Administered 2018-05-22: 5 mg via INTRAVENOUS
  Filled 2018-05-22: qty 2

## 2018-05-22 MED ORDER — OXYCODONE-ACETAMINOPHEN 5-325 MG PO TABS
1.0000 | ORAL_TABLET | Freq: Once | ORAL | Status: AC
  Administered 2018-05-22: 1 via ORAL
  Filled 2018-05-22: qty 1

## 2018-05-22 MED ORDER — ONDANSETRON HCL 4 MG/2ML IJ SOLN
4.0000 mg | Freq: Once | INTRAMUSCULAR | Status: AC
  Administered 2018-05-22: 4 mg via INTRAVENOUS
  Filled 2018-05-22: qty 2

## 2018-05-22 MED ORDER — DIAZEPAM 5 MG PO TABS: 5.0000 mg | ORAL_TABLET | Freq: Three times a day (TID) | ORAL | 0 refills | Status: DC | PRN

## 2018-05-22 MED ORDER — MECLIZINE HCL 25 MG PO TABS
25.0000 mg | ORAL_TABLET | Freq: Once | ORAL | Status: AC
  Administered 2018-05-22: 25 mg via ORAL
  Filled 2018-05-22: qty 1

## 2018-05-22 MED ORDER — PREDNISONE 20 MG PO TABS: ORAL_TABLET | ORAL | 0 refills | Status: AC

## 2018-05-22 MED ORDER — MECLIZINE HCL 25 MG PO TABS: 25.0000 mg | ORAL_TABLET | Freq: Three times a day (TID) | ORAL | 0 refills | Status: AC | PRN

## 2018-05-22 NOTE — ED Triage Notes (Signed)
Pt c/o episode of vertigo around 0230. Pt states that he got dizzy and fell to the ground and vomited. Pt states that he has had bouts of dizziness in the past but not to this extent. Pt denies hitting his head and states that he does not take blood thinners. Pt states that he did not hit his head when he fell.

## 2018-05-22 NOTE — ED Notes (Signed)
Pt IV has been taken out.  

## 2018-05-22 NOTE — ED Provider Notes (Signed)
[facility]   CSN: [id number] Arrival date & time: 05/22/18  0455    History   Chief Complaint Chief Complaint  Patient presents with  . vertigo    HPI Greg Johnston is a 58 y.o. male.     Patient presents to the emergency department for evaluation of dizziness.  Patient reports that he had onset of severe vertigo type dizzy feelings around 2:30 AM.  He reports that he acutely became very diaphoretic.  His sensation of spinning was so severe that he became nauseated, vomited and then fell to the ground from his bed.  He reports that he has intermittent episodes of vertigo over the years, but never anything this severe.  He has not had any chest pain.  No numbness, tingling or weakness in extremities.  Patient reports that his symptoms are better now but he still gets dizzy if he lies flat or on his side.  Also complaining of low back pain.  This is been ongoing for approximately a week.  He has a history of recurrent symptoms of back pain radiating to the legs.  This is no different than previous episodes.  He has been taking ibuprofen without much improvement.  No numbness, tingling or weakness of lower extremities.  No change in bowel or bladder function.     Past Medical History:  Diagnosis Date  . Chronic kidney disease   . Hypertension     There are no active problems to display for this patient.   Past Surgical History:  Procedure Laterality Date  . NO PAST SURGERIES          Home Medications    Prior to Admission medications   Medication Sig Start Date End Date Taking? Authorizing Provider  ALLOPURINOL PO Take 1 tablet by mouth daily.   Yes [provider]  furosemide (LASIX) 40 MG tablet Take 40 mg by mouth as needed for fluid or edema.    Yes [provider]  lisinopril (PRINIVIL,ZESTRIL) 10 MG tablet Take 20 mg by mouth at bedtime.  05/31/15  Yes [provider]  METFORMIN HCL PO  Take 1 tablet by mouth daily.   Yes [provider]  testosterone (ANDROGEL) 50 MG/5GM (1%) GEL Place 5-10 g onto the skin every 14 (fourteen) days.  04/19/15  Yes [provider]  diazepam (VALIUM) 5 MG tablet Take 1 tablet (5 mg total) by mouth every 8 (eight) hours as needed (vertigo). 05/22/18   Gilda Crease,  J, MD  meclizine (ANTIVERT) 25 MG tablet Take 1 tablet (25 mg total) by mouth 3 (three) times daily as needed for dizziness. 05/22/18   , Canary Brimhristopher J, MD  predniSONE (DELTASONE) 20 MG tablet 3 tabs po daily x 3 days, then 2 tabs x 3 days, then 1.5 tabs x 3 days, then 1 tab x 3 days, then 0.5 tabs x 3 days 05/22/18   Gilda Crease,  J, MD    Family History Family History  Problem Relation Age of Onset  . Atrial fibrillation Mother   . Congestive Heart Failure Mother   . Hypertension Mother   . Melanoma Father   . Hypertension Father   . Diabetes Father   . Testicular cancer Brother     Social History Social History   Tobacco Use  . Smoking status: Former Smoker    Packs/day: 3.00    Years: 10.00    Pack years: 30.00    Types: Cigarettes    Last  attempt to quit: 06/27/1993    Years since quitting: 24.9  Substance Use Topics  . Alcohol use: No  . Drug use: No     Allergies   Patient has no known allergies.   Review of Systems Review of Systems  Gastrointestinal: Positive for nausea and vomiting.  Neurological: Positive for dizziness.  All other systems reviewed and are negative.    Physical Exam Updated Vital Signs BP (!) 174/94 (BP Location: Right Arm)   Pulse 67   Temp 97.6 F (36.4 C) (Oral)   Resp 20   Ht 6\' 1"  (1.854 m)   Wt 122.5 kg   SpO2 97%   BMI 35.62 kg/m   Physical Exam Vitals signs and nursing note reviewed.  Constitutional:      General: He is not in acute distress.    Appearance: Normal appearance. He is well-developed.  HENT:     Head: Normocephalic and atraumatic.     Right Ear: Hearing normal.      Left Ear: Hearing normal.     Nose: Nose normal.  Eyes:     Conjunctiva/sclera: Conjunctivae normal.     Pupils: Pupils are equal, round, and reactive to light.  Neck:     Musculoskeletal: Normal range of motion and neck supple.  Cardiovascular:     Rate and Rhythm: Regular rhythm.     Heart sounds: S1 normal and S2 normal. No murmur. No friction rub. No gallop.   Pulmonary:     Effort: Pulmonary effort is normal. No respiratory distress.     Breath sounds: Normal breath sounds.  Chest:     Chest wall: No tenderness.  Abdominal:     General: Bowel sounds are normal.     Palpations: Abdomen is soft.     Tenderness: There is no abdominal tenderness. There is no guarding or rebound. Negative signs include Murphy's sign and McBurney's sign.     Hernia: No hernia is present.  Musculoskeletal: Normal range of motion.  Skin:    General: Skin is warm and dry.     Findings: No rash.  Neurological:     Mental Status: He is alert and oriented to person, place, and time.     GCS: GCS eye subscore is 4. GCS verbal subscore is 5. GCS motor subscore is 6.     Cranial Nerves: No cranial nerve deficit.     Sensory: No sensory deficit.     Coordination: Coordination normal.     Comments: Extraocular muscle movement: normal No visual field cut Pupils: equal and reactive both direct and consensual response is normal No nystagmus present    Sensory function is intact to light touch, pinprick Proprioception intact  Grip strength 5/5 symmetric in upper extremities No pronator drift Normal finger to nose bilaterally  Lower extremity strength 5/5 against gravity Normal heel to shin bilaterally  Gait: normal   Psychiatric:        Speech: Speech normal.        Behavior: Behavior normal.        Thought Content: Thought content normal.      ED Treatments / Results  Labs (all labs ordered are listed, but only abnormal results are displayed) Labs Reviewed  BASIC METABOLIC PANEL -  Abnormal; Notable for the following components:      Result Value   Glucose, Bld 203 (*)    All other components within normal limits  CBC WITH DIFFERENTIAL/PLATELET  TROPONIN I    EKG None  Radiology  No results found.  Procedures Procedures (including critical care time)  Medications Ordered in ED Medications  sodium chloride 0.9 % bolus 1,000 mL (1,000 mLs Intravenous Bolus from Bag 05/22/18 0610)  ondansetron (ZOFRAN) injection 4 mg (4 mg Intravenous Given 05/22/18 5997)  meclizine (ANTIVERT) tablet 25 mg (25 mg Oral Given 05/22/18 0607)  diazepam (VALIUM) injection 5 mg (5 mg Intravenous Given 05/22/18 7414)  oxyCODONE-acetaminophen (PERCOCET/ROXICET) 5-325 MG per tablet 1 tablet (1 tablet Oral Given 05/22/18 0620)  ibuprofen (ADVIL,MOTRIN) tablet 800 mg (800 mg Oral Given 05/22/18 0620)     Initial Impression / Assessment and Plan / ED Course  I have reviewed the triage vital signs and the nursing notes.  Pertinent labs & imaging results that were available during my care of the patient were reviewed by me and considered in my medical decision making (see chart for details).        Patient presents to the ER for evaluation of vertigo.  He does have a history of recurrent vertigo in the past, but this is the most severe and longest lasting episode he has had.  His examination at arrival was reassuring.  He does not have any sign of cerebellar dysfunction.  Neurologic function is completely intact.  Based on his history of vertigo, I recommended empiric treatment.  He was given IV fluids, meclizine, IV Valium.  Upon recheck he has significantly improved, no further dizziness.  I did discuss with him the slim possibility of stroke and vertebral basilar insufficiency.  We discussed the possibility of doing MRIs, but he reports that he feels much improved and feels well enough to go home.  He will return if he has worsening symptoms.  As far as his back pain goes, he does not have  any sign of neurologic dysfunction.  He has a history of sciatica and this feels similar.  Will provide analgesia and prednisone.  Follow-up with PCP.  Final Clinical Impressions(s) / ED Diagnoses   Final diagnoses:  Sciatica, unspecified laterality  Vertigo    ED Discharge Orders         Ordered    meclizine (ANTIVERT) 25 MG tablet  3 times daily PRN     05/22/18 0717    predniSONE (DELTASONE) 20 MG tablet     05/22/18 0717    diazepam (VALIUM) 5 MG tablet  Every 8 hours PRN     05/22/18 0717           Gilda Crease, MD 05/22/18 [phone]

## 2018-06-03 DIAGNOSIS — E291 Testicular hypofunction: Secondary | ICD-10-CM | POA: Diagnosis not present

## 2018-06-11 DIAGNOSIS — I129 Hypertensive chronic kidney disease with stage 1 through stage 4 chronic kidney disease, or unspecified chronic kidney disease: Secondary | ICD-10-CM | POA: Diagnosis not present

## 2018-06-11 DIAGNOSIS — E559 Vitamin D deficiency, unspecified: Secondary | ICD-10-CM | POA: Diagnosis not present

## 2018-06-11 DIAGNOSIS — N052 Unspecified nephritic syndrome with diffuse membranous glomerulonephritis: Secondary | ICD-10-CM | POA: Diagnosis not present

## 2018-06-17 DIAGNOSIS — E291 Testicular hypofunction: Secondary | ICD-10-CM | POA: Diagnosis not present

## 2018-06-17 DIAGNOSIS — N052 Unspecified nephritic syndrome with diffuse membranous glomerulonephritis: Secondary | ICD-10-CM | POA: Diagnosis not present

## 2018-06-17 DIAGNOSIS — E559 Vitamin D deficiency, unspecified: Secondary | ICD-10-CM | POA: Diagnosis not present

## 2018-06-17 DIAGNOSIS — I129 Hypertensive chronic kidney disease with stage 1 through stage 4 chronic kidney disease, or unspecified chronic kidney disease: Secondary | ICD-10-CM | POA: Diagnosis not present

## 2018-07-01 DIAGNOSIS — E291 Testicular hypofunction: Secondary | ICD-10-CM | POA: Diagnosis not present

## 2018-07-15 DIAGNOSIS — E291 Testicular hypofunction: Secondary | ICD-10-CM | POA: Diagnosis not present

## 2018-07-29 DIAGNOSIS — E291 Testicular hypofunction: Secondary | ICD-10-CM | POA: Diagnosis not present

## 2018-07-30 DIAGNOSIS — E1149 Type 2 diabetes mellitus with other diabetic neurological complication: Secondary | ICD-10-CM | POA: Diagnosis not present

## 2018-07-30 DIAGNOSIS — E291 Testicular hypofunction: Secondary | ICD-10-CM | POA: Diagnosis not present

## 2018-07-30 DIAGNOSIS — K8020 Calculus of gallbladder without cholecystitis without obstruction: Secondary | ICD-10-CM | POA: Diagnosis not present

## 2018-07-30 DIAGNOSIS — E1142 Type 2 diabetes mellitus with diabetic polyneuropathy: Secondary | ICD-10-CM | POA: Diagnosis not present

## 2018-08-12 DIAGNOSIS — E291 Testicular hypofunction: Secondary | ICD-10-CM | POA: Diagnosis not present

## 2018-08-26 DIAGNOSIS — E291 Testicular hypofunction: Secondary | ICD-10-CM | POA: Diagnosis not present

## 2018-09-09 DIAGNOSIS — E291 Testicular hypofunction: Secondary | ICD-10-CM | POA: Diagnosis not present

## 2018-09-23 DIAGNOSIS — E291 Testicular hypofunction: Secondary | ICD-10-CM | POA: Diagnosis not present

## 2018-10-07 DIAGNOSIS — E291 Testicular hypofunction: Secondary | ICD-10-CM | POA: Diagnosis not present

## 2018-10-21 DIAGNOSIS — E291 Testicular hypofunction: Secondary | ICD-10-CM | POA: Diagnosis not present

## 2018-11-04 DIAGNOSIS — E291 Testicular hypofunction: Secondary | ICD-10-CM | POA: Diagnosis not present

## 2018-11-22 DIAGNOSIS — E291 Testicular hypofunction: Secondary | ICD-10-CM | POA: Diagnosis not present

## 2018-11-28 DIAGNOSIS — K8020 Calculus of gallbladder without cholecystitis without obstruction: Secondary | ICD-10-CM | POA: Diagnosis not present

## 2018-11-28 DIAGNOSIS — N200 Calculus of kidney: Secondary | ICD-10-CM | POA: Diagnosis not present

## 2018-11-28 DIAGNOSIS — E1149 Type 2 diabetes mellitus with other diabetic neurological complication: Secondary | ICD-10-CM | POA: Diagnosis not present

## 2018-11-28 DIAGNOSIS — E1142 Type 2 diabetes mellitus with diabetic polyneuropathy: Secondary | ICD-10-CM | POA: Diagnosis not present

## 2018-12-06 DIAGNOSIS — E291 Testicular hypofunction: Secondary | ICD-10-CM | POA: Diagnosis not present

## 2018-12-20 DIAGNOSIS — E291 Testicular hypofunction: Secondary | ICD-10-CM | POA: Diagnosis not present

## 2018-12-21 DIAGNOSIS — Z23 Encounter for immunization: Secondary | ICD-10-CM | POA: Diagnosis not present

## 2019-01-03 DIAGNOSIS — E291 Testicular hypofunction: Secondary | ICD-10-CM | POA: Diagnosis not present

## 2019-01-17 DIAGNOSIS — E291 Testicular hypofunction: Secondary | ICD-10-CM | POA: Diagnosis not present

## 2019-01-31 DIAGNOSIS — E291 Testicular hypofunction: Secondary | ICD-10-CM | POA: Diagnosis not present

## 2019-02-14 DIAGNOSIS — E291 Testicular hypofunction: Secondary | ICD-10-CM | POA: Diagnosis not present

## 2019-02-28 DIAGNOSIS — E291 Testicular hypofunction: Secondary | ICD-10-CM | POA: Diagnosis not present

## 2019-03-13 DIAGNOSIS — E291 Testicular hypofunction: Secondary | ICD-10-CM | POA: Diagnosis not present

## 2019-03-27 DIAGNOSIS — K8020 Calculus of gallbladder without cholecystitis without obstruction: Secondary | ICD-10-CM | POA: Diagnosis not present

## 2019-03-27 DIAGNOSIS — N052 Unspecified nephritic syndrome with diffuse membranous glomerulonephritis: Secondary | ICD-10-CM | POA: Diagnosis not present

## 2019-03-27 DIAGNOSIS — E1142 Type 2 diabetes mellitus with diabetic polyneuropathy: Secondary | ICD-10-CM | POA: Diagnosis not present

## 2019-03-27 DIAGNOSIS — E1149 Type 2 diabetes mellitus with other diabetic neurological complication: Secondary | ICD-10-CM | POA: Diagnosis not present

## 2019-03-28 DIAGNOSIS — E291 Testicular hypofunction: Secondary | ICD-10-CM | POA: Diagnosis not present

## 2019-04-01 DIAGNOSIS — E1149 Type 2 diabetes mellitus with other diabetic neurological complication: Secondary | ICD-10-CM | POA: Diagnosis not present

## 2019-04-01 DIAGNOSIS — E7849 Other hyperlipidemia: Secondary | ICD-10-CM | POA: Diagnosis not present

## 2019-04-01 DIAGNOSIS — E291 Testicular hypofunction: Secondary | ICD-10-CM | POA: Diagnosis not present

## 2019-04-16 DIAGNOSIS — H20011 Primary iridocyclitis, right eye: Secondary | ICD-10-CM | POA: Diagnosis not present

## 2019-04-29 DIAGNOSIS — E119 Type 2 diabetes mellitus without complications: Secondary | ICD-10-CM | POA: Diagnosis not present

## 2019-05-22 DIAGNOSIS — E118 Type 2 diabetes mellitus with unspecified complications: Secondary | ICD-10-CM | POA: Diagnosis not present

## 2019-06-10 DIAGNOSIS — E7849 Other hyperlipidemia: Secondary | ICD-10-CM | POA: Diagnosis not present

## 2019-06-22 DIAGNOSIS — E118 Type 2 diabetes mellitus with unspecified complications: Secondary | ICD-10-CM | POA: Diagnosis not present

## 2019-07-11 ENCOUNTER — Other Ambulatory Visit: Payer: Self-pay

## 2019-07-11 ENCOUNTER — Emergency Department (HOSPITAL_COMMUNITY): Payer: BC Managed Care – PPO

## 2019-07-11 ENCOUNTER — Emergency Department (HOSPITAL_COMMUNITY)
Admission: EM | Admit: 2019-07-11 | Discharge: 2019-07-11 | Disposition: A | Discharge status: Home / Self Care | Payer: BC Managed Care – PPO | Attending: Emergency Medicine | Admitting: Emergency Medicine

## 2019-07-11 ENCOUNTER — Encounter (HOSPITAL_COMMUNITY): Payer: Self-pay

## 2019-07-11 ENCOUNTER — Telehealth: Payer: Self-pay | Admitting: Urology

## 2019-07-11 DIAGNOSIS — N200 Calculus of kidney: Secondary | ICD-10-CM

## 2019-07-11 DIAGNOSIS — E1122 Type 2 diabetes mellitus with diabetic chronic kidney disease: Secondary | ICD-10-CM | POA: Diagnosis not present

## 2019-07-11 DIAGNOSIS — N132 Hydronephrosis with renal and ureteral calculous obstruction: Secondary | ICD-10-CM | POA: Insufficient documentation

## 2019-07-11 DIAGNOSIS — Z79899 Other long term (current) drug therapy: Secondary | ICD-10-CM | POA: Insufficient documentation

## 2019-07-11 DIAGNOSIS — I10 Essential (primary) hypertension: Secondary | ICD-10-CM | POA: Diagnosis not present

## 2019-07-11 DIAGNOSIS — Z7984 Long term (current) use of oral hypoglycemic drugs: Secondary | ICD-10-CM | POA: Insufficient documentation

## 2019-07-11 DIAGNOSIS — R109 Unspecified abdominal pain: Secondary | ICD-10-CM | POA: Diagnosis not present

## 2019-07-11 DIAGNOSIS — I129 Hypertensive chronic kidney disease with stage 1 through stage 4 chronic kidney disease, or unspecified chronic kidney disease: Secondary | ICD-10-CM | POA: Diagnosis not present

## 2019-07-11 DIAGNOSIS — N189 Chronic kidney disease, unspecified: Secondary | ICD-10-CM | POA: Diagnosis not present

## 2019-07-11 HISTORY — DX: Type 2 diabetes mellitus without complications: E11.9

## 2019-07-11 LAB — URINALYSIS, ROUTINE W REFLEX MICROSCOPIC
Bacteria, UA: NONE SEEN
Bilirubin Urine: NEGATIVE
Glucose, UA: NEGATIVE mg/dL
Ketones, ur: 5 mg/dL — AB
Leukocytes,Ua: NEGATIVE
Nitrite: NEGATIVE
Protein, ur: NEGATIVE mg/dL
Specific Gravity, Urine: 1.015 (ref 1.005–1.030)
pH: 5 (ref 5.0–8.0)

## 2019-07-11 LAB — CBC WITH DIFFERENTIAL/PLATELET
Abs Immature Granulocytes: 0.03 10*3/uL (ref 0.00–0.07)
Basophils Absolute: 0 10*3/uL (ref 0.0–0.1)
Basophils Relative: 0 %
Eosinophils Absolute: 0.1 10*3/uL (ref 0.0–0.5)
Eosinophils Relative: 1 %
HCT: 45.1 % (ref 39.0–52.0)
Hemoglobin: 15.5 g/dL (ref 13.0–17.0)
Immature Granulocytes: 0 %
Lymphocytes Relative: 10 %
Lymphs Abs: 1 10*3/uL (ref 0.7–4.0)
MCH: 32.6 pg (ref 26.0–34.0)
MCHC: 34.4 g/dL (ref 30.0–36.0)
MCV: 94.9 fL (ref 80.0–100.0)
Monocytes Absolute: 0.9 10*3/uL (ref 0.1–1.0)
Monocytes Relative: 8 %
Neutro Abs: 8.3 10*3/uL — ABNORMAL HIGH (ref 1.7–7.7)
Neutrophils Relative %: 81 %
Platelets: 224 10*3/uL (ref 150–400)
RBC: 4.75 MIL/uL (ref 4.22–5.81)
RDW: 13.1 % (ref 11.5–15.5)
WBC: 10.4 10*3/uL (ref 4.0–10.5)
nRBC: 0 % (ref 0.0–0.2)

## 2019-07-11 LAB — COMPREHENSIVE METABOLIC PANEL
ALT: 24 U/L (ref 0–44)
AST: 22 U/L (ref 15–41)
Albumin: 5 g/dL (ref 3.5–5.0)
Alkaline Phosphatase: 53 U/L (ref 38–126)
Anion gap: 10 (ref 5–15)
BUN: 19 mg/dL (ref 6–20)
CO2: 26 mmol/L (ref 22–32)
Calcium: 10.1 mg/dL (ref 8.9–10.3)
Chloride: 101 mmol/L (ref 98–111)
Creatinine, Ser: 1.45 mg/dL — ABNORMAL HIGH (ref 0.61–1.24)
GFR calc Af Amer: >60 mL/min (ref >60)
GFR calc non Af Amer: 52 mL/min — ABNORMAL LOW (ref >60)
Glucose, Bld: 135 mg/dL — ABNORMAL HIGH (ref 70–99)
Potassium: 4.2 mmol/L (ref 3.5–5.1)
Sodium: 137 mmol/L (ref 135–145)
Total Bilirubin: 0.9 mg/dL (ref 0.3–1.2)
Total Protein: 8.1 g/dL (ref 6.5–8.1)

## 2019-07-11 LAB — I-STAT CHEM 8, ED
BUN: 17 mg/dL (ref 6–20)
Calcium, Ion: 1.24 mmol/L (ref 1.15–1.40)
Chloride: 100 mmol/L (ref 98–111)
Creatinine, Ser: 1.3 mg/dL — ABNORMAL HIGH (ref 0.61–1.24)
Glucose, Bld: 106 mg/dL — ABNORMAL HIGH (ref 70–99)
HCT: 34 % — ABNORMAL LOW (ref 39.0–52.0)
Hemoglobin: 11.6 g/dL — ABNORMAL LOW (ref 13.0–17.0)
Potassium: 4.1 mmol/L (ref 3.5–5.1)
Sodium: 138 mmol/L (ref 135–145)
TCO2: 26 mmol/L (ref 22–32)

## 2019-07-11 MED ORDER — OXYCODONE-ACETAMINOPHEN 5-325 MG PO TABS
1.0000 | ORAL_TABLET | Freq: Once | ORAL | Status: AC
  Administered 2019-07-11: 1 via ORAL
  Filled 2019-07-11: qty 1

## 2019-07-11 MED ORDER — SODIUM CHLORIDE 0.9 % IV BOLUS
1000.0000 mL | Freq: Once | INTRAVENOUS | Status: AC
  Administered 2019-07-11: 1000 mL via INTRAVENOUS

## 2019-07-11 MED ORDER — OXYCODONE-ACETAMINOPHEN 5-325 MG PO TABS: 1.0000 | ORAL_TABLET | Freq: Four times a day (QID) | ORAL | 0 refills | Status: DC | PRN

## 2019-07-11 MED ORDER — ONDANSETRON HCL 4 MG/2ML IJ SOLN
4.0000 mg | Freq: Once | INTRAMUSCULAR | Status: AC
  Administered 2019-07-11: 18:00:00 4 mg via INTRAVENOUS
  Filled 2019-07-11: qty 2

## 2019-07-11 MED ORDER — HYDROMORPHONE HCL 1 MG/ML IJ SOLN
0.5000 mg | Freq: Once | INTRAMUSCULAR | Status: AC
  Administered 2019-07-11: 23:00:00 0.5 mg via INTRAVENOUS
  Filled 2019-07-11: qty 1

## 2019-07-11 MED ORDER — ONDANSETRON 4 MG PO TBDP
4.0000 mg | ORAL_TABLET | Freq: Three times a day (TID) | ORAL | 0 refills | Status: AC | PRN
Start: 2019-07-11 — End: ?

## 2019-07-11 MED ORDER — HYDROMORPHONE HCL 1 MG/ML IJ SOLN
1.0000 mg | Freq: Once | INTRAMUSCULAR | Status: AC
  Administered 2019-07-11: 18:00:00 1 mg via INTRAVENOUS
  Filled 2019-07-11: qty 1

## 2019-07-11 MED ORDER — KETOROLAC TROMETHAMINE 30 MG/ML IJ SOLN
15.0000 mg | Freq: Once | INTRAMUSCULAR | Status: AC
  Administered 2019-07-11: 15 mg via INTRAVENOUS
  Filled 2019-07-11: qty 1

## 2019-07-11 MED ORDER — SODIUM CHLORIDE 0.9 % IV BOLUS
1000.0000 mL | Freq: Once | INTRAVENOUS | Status: AC
  Administered 2019-07-11: 21:00:00 1000 mL via INTRAVENOUS

## 2019-07-11 MED ORDER — ONDANSETRON HCL 4 MG/2ML IJ SOLN
4.0000 mg | Freq: Once | INTRAMUSCULAR | Status: AC
  Administered 2019-07-11: 23:00:00 4 mg via INTRAVENOUS
  Filled 2019-07-11: qty 2

## 2019-07-11 NOTE — ED Notes (Signed)
Pt sitting up in a chair. Ambulatory in the room. Pt ready for d/c

## 2019-07-11 NOTE — Discharge Instructions (Signed)
You can take Tylenol or Ibuprofen as directed for pain. You can alternate Tylenol and Ibuprofen every 4 hours. If you take Tylenol at 1pm, then you can take Ibuprofen at 5pm. Then you can take Tylenol again at 9pm.   Take pain meds as directed. Take zofran for nausea.  Continue taking flomax as directed.   You will need to follow up with Dr. Carylon Perches (Urology). Call his office for appointments on Monday.  Return to the Emergency Dept for worsening abdominal pain, vomiting, fever or any other worsening or concerning symptoms.

## 2019-07-11 NOTE — ED Triage Notes (Signed)
Patient c/o intermittent left flank pain x 4 days. Patient states he has a know kidney stone. Patient states he has ben taking an old prescription of pain meds and does not know the name. Patient voiding more frequent small amounts. Patient states he took a flomax last night.

## 2019-07-11 NOTE — ED Notes (Signed)
After administering Dilaudid, pts o2sat decreased to 85%. RN instructed pt to do deep breathing exercises. Pt performed exercises and o2sat increased to 89%. RN placed pt on [facility] and now pts o2sat is 98%

## 2019-07-11 NOTE — ED Provider Notes (Signed)
[facility]-EMERGENCY DEPT Provider Note   CSN: [id number] Arrival date & time: 07/11/19  1647     History Chief Complaint  Patient presents with  . Flank Pain    Greg Johnston is a 59 y.o. male who presents for evaluation of left flank pain x4 days.  He reports that initially started about 4 days ago and pain was intermittent.  He states it would sometimes be sharp pain.  He has a history of nephrotic syndrome and sees nephrology.  He called them and they prescribed him some Flomax.  He has been taking that as well as oxycodone that he had from a previous illness, Flexeril, Tylenol with minimal improvement he reports that since last night, the pain has become more constant more severe and started radiating to his left abdomen.  He had episode of nausea/vomiting last night and since then he has not wanted to eat much.  No fevers.  He states he was told he had a history of nonobstructing kidney stone several years ago but otherwise has not had any issues from them.  His pain is worse when he moves around.  He has had some decreased urine output/stream but no dysuria, hematuria. Denies fevers, chills, CP, SOB.  The history is provided by the patient.       Past Medical History:  Diagnosis Date  . Chronic kidney disease   . Diabetes mellitus without complication (HCC)   . Hypertension     There are no problems to display for this patient.   Past Surgical History:  Procedure Laterality Date  . NO PAST SURGERIES         Family History  Problem Relation Age of Onset  . Atrial fibrillation Mother   . Congestive Heart Failure Mother   . Hypertension Mother   . Melanoma Father   . Hypertension Father   . Diabetes Father   . Testicular cancer Brother     Social History   Tobacco Use  . Smoking status: Former Smoker    Packs/day: 3.00    Years: 10.00    Pack years: 30.00    Types: Cigarettes    Quit date: 06/27/1993    Years since quitting: 26.0  .  Smokeless tobacco: Never Used  Substance Use Topics  . Alcohol use: No  . Drug use: No    Home Medications Prior to Admission medications   Medication Sig Start Date End Date Taking? Authorizing Provider  allopurinol (ZYLOPRIM) 100 MG tablet Take 100 mg by mouth daily. 03/04/19  Yes [provider]  Alpha-Lipoic Acid 100 MG TABS Take 1 tablet by mouth daily.   Yes [provider]  amLODipine (NORVASC) 5 MG tablet Take 5 mg by mouth daily. 05/17/19  Yes [provider]  aspirin EC 325 MG tablet Take 650 mg by mouth daily.   Yes [provider]  losartan (COZAAR) 25 MG tablet Take 25 mg by mouth daily. 06/21/19  Yes [provider]  metFORMIN (GLUCOPHAGE) 500 MG tablet Take 500 mg by mouth 2 (two) times daily. 06/09/19  Yes [provider]  tadalafil (CIALIS) 5 MG tablet Take 5 mg by mouth daily as needed for erectile dysfunction.  06/19/19  Yes [provider]  tamsulosin (FLOMAX) 0.4 MG CAPS capsule Take 0.4 mg by mouth daily. 07/10/19  Yes [provider]  diazepam (VALIUM) 5 MG tablet Take 1 tablet (5 mg total) by mouth every 8 (eight) hours as needed (vertigo). Patient not taking:  Reported on 07/11/2019 05/22/18   Gilda Crease, MD  meclizine (ANTIVERT) 25 MG tablet Take 1 tablet (25 mg total) by mouth 3 (three) times daily as needed for dizziness. Patient not taking: Reported on 07/11/2019 05/22/18   Gilda Crease, MD  ondansetron (ZOFRAN ODT) 4 MG disintegrating tablet Take 1 tablet (4 mg total) by mouth every 8 (eight) hours as needed for nausea or vomiting. 07/11/19   Maxwell Caul, PA-C  oxyCODONE-acetaminophen (PERCOCET/ROXICET) 5-325 MG tablet Take 1-2 tablets by mouth every 6 (six) hours as needed for severe pain. 07/11/19   Maxwell Caul, PA-C  predniSONE (DELTASONE) 20 MG tablet 3 tabs po daily x 3 days, then 2 tabs x 3 days, then 1.5 tabs x 3 days, then 1 tab x 3 days, then 0.5 tabs x 3  days Patient not taking: Reported on 07/11/2019 05/22/18   Gilda Crease, MD  testosterone (ANDROGEL) 50 MG/5GM (1%) GEL Place 5-10 g onto the skin every 14 (fourteen) days.  04/19/15   [provider]    Allergies    Lisinopril  Review of Systems   Review of Systems  Constitutional: Negative for fever.  Respiratory: Negative for cough and shortness of breath.   Cardiovascular: Negative for chest pain.  Gastrointestinal: Positive for abdominal pain, nausea and vomiting.  Genitourinary: Positive for difficulty urinating and flank pain. Negative for dysuria and hematuria.  Neurological: Negative for headaches.  All other systems reviewed and are negative.   Physical Exam Updated Vital Signs BP 123/74   Pulse (!) 57   Temp 99.1 F (37.3 C) (Oral)   Resp 16   Ht 6\' 2"  (1.88 m)   Wt 112 kg   SpO2 98%   BMI 31.71 kg/m   Physical Exam Vitals and nursing note reviewed.  Constitutional:      Appearance: Normal appearance. He is well-developed.     Comments: NAD  HENT:     Head: Normocephalic and atraumatic.  Eyes:     General: Lids are normal.     Conjunctiva/sclera: Conjunctivae normal.     Pupils: Pupils are equal, round, and reactive to light.  Cardiovascular:     Rate and Rhythm: Normal rate and regular rhythm.     Pulses: Normal pulses.          Radial pulses are 2+ on the right side and 2+ on the left side.       Dorsalis pedis pulses are 2+ on the right side.     Heart sounds: Normal heart sounds. No murmur. No friction rub. No gallop.   Pulmonary:     Effort: Pulmonary effort is normal.     Breath sounds: Normal breath sounds.     Comments: Lungs clear to auscultation bilaterally.  Symmetric chest rise.  No wheezing, rales, rhonchi. Abdominal:     Palpations: Abdomen is soft. Abdomen is not rigid.     Tenderness: There is no abdominal tenderness. There is no right CVA tenderness, left CVA tenderness or guarding.     Comments: Abdomen is soft,  non-distended, non-tender. No rigidity, No guarding. No peritoneal signs. No true CVA tenderness. Patient reports feeling that's where his pain is at but it doesn't necessarily worsen by palpation.  Musculoskeletal:        General: Normal range of motion.     Cervical back: Full passive range of motion without pain.  Skin:    General: Skin is warm and dry.     Capillary Refill:  Capillary refill takes less than 2 seconds.  Neurological:     Mental Status: He is alert and oriented to person, place, and time.  Psychiatric:        Speech: Speech normal.     ED Results / Procedures / Treatments   Labs (all labs ordered are listed, but only abnormal results are displayed) Labs Reviewed  URINALYSIS, ROUTINE W REFLEX MICROSCOPIC - Abnormal; Notable for the following components:      Result Value   Hgb urine dipstick SMALL (*)    Ketones, ur 5 (*)    All other components within normal limits  COMPREHENSIVE METABOLIC PANEL - Abnormal; Notable for the following components:   Glucose, Bld 135 (*)    Creatinine, Ser 1.45 (*)    GFR calc non Af Amer 52 (*)    All other components within normal limits  CBC WITH DIFFERENTIAL/PLATELET - Abnormal; Notable for the following components:   Neutro Abs 8.3 (*)    All other components within normal limits  I-STAT CHEM 8, ED - Abnormal; Notable for the following components:   Creatinine, Ser 1.30 (*)    Glucose, Bld 106 (*)    Hemoglobin 11.6 (*)    HCT 34.0 (*)    All other components within normal limits    EKG EKG Interpretation  Date/Time:  Friday July 11 2019 17:48:43 EDT Ventricular Rate:  71 PR Interval:    QRS Duration: 98 QT Interval:  372 QTC Calculation: 405 R Axis:   62 Text Interpretation: Sinus rhythm Normal ECG No previous tracing Confirmed by Gwyneth Sprout ([id number]) on 07/11/2019 5:49:36 PM   Radiology CT Renal Stone Study  Result Date: 07/11/2019 CLINICAL DATA:  Left-sided flank pain for several days EXAM: CT ABDOMEN  AND PELVIS WITHOUT CONTRAST TECHNIQUE: Multidetector CT imaging of the abdomen and pelvis was performed following the standard protocol without IV contrast. COMPARISON:  10/12/2015 FINDINGS: Lower chest: Mild bibasilar atelectasis is noted slightly worse on the left than the right. No sizable is seen. Hepatobiliary: Gallbladder is well distended with multiple gallstones. No obstructive changes are seen. The liver is within normal limits. Pancreas: Unremarkable. No pancreatic ductal dilatation or surrounding inflammatory changes. Spleen: Normal in size without focal abnormality. Adrenals/Urinary Tract: Adrenal glands are within normal limits. Right kidney is within normal limits. Left kidney demonstrates perinephric stranding and hydronephrosis as well as proximal hydroureter. This is secondary to a 9 mm proximal left ureteral stone. The more distal left ureter is within normal limits. Bladder is decompressed. Stomach/Bowel: Colon shows no obstructive or inflammatory changes. The appendix is within normal limits. No small bowel or gastric abnormality is noted. Vascular/Lymphatic: Aortic atherosclerosis. No enlarged abdominal or pelvic lymph nodes. Reproductive: Prostate is unremarkable. Other: No abdominal wall hernia or abnormality. No abdominopelvic ascites. Musculoskeletal: Degenerative changes of lumbar spine are noted. IMPRESSION: 9 mm proximal left ureteral stone with hydronephrosis and hydroureter. Mild bibasilar atelectasis is seen. Cholelithiasis without complicating factors. Electronically Signed   By: Alcide Clever M.D.   On: 07/11/2019 19:40    Procedures Procedures (including critical care time)  Medications Ordered in ED Medications  oxyCODONE-acetaminophen (PERCOCET/ROXICET) 5-325 MG per tablet 1 tablet (has no administration in time range)  sodium chloride 0.9 % bolus 1,000 mL (0 mLs Intravenous Stopped 07/11/19 1838)  ondansetron (ZOFRAN) injection 4 mg (4 mg Intravenous Given 07/11/19 1748)    HYDROmorphone (DILAUDID) injection 1 mg (1 mg Intravenous Given 07/11/19 1750)  ketorolac (TORADOL) 30 MG/ML injection 15 mg (15 mg  Intravenous Given 07/11/19 1932)  sodium chloride 0.9 % bolus 1,000 mL (0 mLs Intravenous Stopped 07/11/19 2231)  ondansetron (ZOFRAN) injection 4 mg (4 mg Intravenous Given 07/11/19 2233)  HYDROmorphone (DILAUDID) injection 0.5 mg (0.5 mg Intravenous Given 07/11/19 2233)    ED Course  I have reviewed the triage vital signs and the nursing notes.  Pertinent labs & imaging results that were available during my care of the patient were reviewed by me and considered in my medical decision making (see chart for details).    MDM Rules/Calculators/A&P                      59 year old male resents for evaluation of left-sided flank pain x4 days.  Worsened since last night.  Feels like it is radiating into his abdomen.  Associate with nausea/vomiting last night.  No chest pain, difficulty breathing.  On initial ED arrival, he is afebrile, nontoxic-appearing.  Vital signs are stable.  On exam, he does have some pain in the left lower flank but states it is not worsened by palpation.  No overlying rash.  Consider GU etiology such as UTI versus kidney stone.  History/physical exam not concerning for dissection, ACS etiology.  CMP shows BUN of 19, creatinine 1.45.  This is slightly above his baseline but the last comparison was in March 2020.  CBC shows no leukocytosis or anemia.  UA shows small hemoglobin.  No other infectious etiology.  Plan for CT renal study for evaluation of possible kidney stone.  CT scan shows a 9 mm proximal left ureteral stone with hydronephrosis and hydroureter.  Mild bibasilar atelectasis and cholelithiasis without complication seen.  Discussed patient with Dr. Carylon Perches (Urology) who recommends that we give patient an additional liter of fluids and recheck his creatinine to see if it is responsive to fluids. If his creatinine improves and she he is able  to tolerate PO and pain, it is reasonable for him to follow up on Monday.   Repeat Chem-8 after fluids shows a creatinine of 1.30.  Reevaluation.  Patient is sitting comfortably.  He says his pain is improved.  Will give additional analgesics and reassess.  Reevaluation.  Patient reports improvement in pain.  Ambulated patient patient feels comfortable.  He has not had any more vomiting here in the ED.  Will give a course of pain medication, Zofran.  He already has Flomax at home.  Instructed him to take that medication as directed.  Patient is hemodynamically stable with no vomiting.  He reports controlled pain.  Patient is agreeable for discharge home.  I discussed with patient that if at any time, his symptoms get worse, he can return emergency department.  Patient instructed to follow-up with urology as directed on Monday. At this time, patient exhibits no emergent life-threatening condition that require further evaluation in ED or admission. Patient had ample opportunity for questions and discussion. All patient's questions were answered with full understanding. Strict return precautions discussed. Patient expresses understanding and agreement to plan.   Portions of this note were generated with Scientist, clinical (histocompatibility and immunogenetics). Dictation errors may occur despite best attempts at proofreading.   Final Clinical Impression(s) / ED Diagnoses Final diagnoses:  Kidney stone    Rx / DC Orders ED Discharge Orders         Ordered    Ambulate in room     07/11/19 2259    ondansetron (ZOFRAN ODT) 4 MG disintegrating tablet  Every 8 hours PRN  07/11/19 2308    oxyCODONE-acetaminophen (PERCOCET/ROXICET) 5-325 MG tablet  Every 6 hours PRN     07/11/19 2308           Desma Mcgregor 07/11/19 2317    Blanchie Dessert, MD 07/18/19 0003

## 2019-07-11 NOTE — Telephone Encounter (Signed)
Call received from [place].   Left flank pain.  CT with 53m left proximal ureteral stone and hydronephrosis.  Afebrile. No tachycardia. No hypotension.  Clinically no fevers/chills but did have nausea and emesis. No leukocytosis.  UA without concern for infection.  Cr 1.45 from baseline 1.09 Hydrating now.   Recommendations: Ensure able to hydrate. Ensure creatinine is fluid responsive. If creatinine is not fluid responsive would plan for ureteral stent placement given stone size and location.  If creatinine is responsive, pain controlled, tolerating fluids, could discharge with MET with close clinical follow up. Strict return precautions.

## 2019-07-11 NOTE — ED Notes (Signed)
Pt able to tolerate PO fluids without difficulty.  

## 2019-07-14 ENCOUNTER — Other Ambulatory Visit: Payer: Self-pay | Admitting: Urology

## 2019-07-14 ENCOUNTER — Other Ambulatory Visit (HOSPITAL_COMMUNITY)
Admission: RE | Admit: 2019-07-14 | Discharge: 2019-07-14 | Disposition: A | Discharge status: Home / Self Care | Payer: BC Managed Care – PPO | Source: Ambulatory Visit | Attending: Urology | Admitting: Urology

## 2019-07-14 DIAGNOSIS — Z01812 Encounter for preprocedural laboratory examination: Secondary | ICD-10-CM | POA: Insufficient documentation

## 2019-07-14 DIAGNOSIS — Z20822 Contact with and (suspected) exposure to covid-19: Secondary | ICD-10-CM | POA: Diagnosis not present

## 2019-07-14 DIAGNOSIS — N201 Calculus of ureter: Secondary | ICD-10-CM | POA: Diagnosis not present

## 2019-07-14 DIAGNOSIS — N200 Calculus of kidney: Secondary | ICD-10-CM

## 2019-07-14 DIAGNOSIS — R1084 Generalized abdominal pain: Secondary | ICD-10-CM | POA: Diagnosis not present

## 2019-07-15 LAB — SARS CORONAVIRUS 2 (TAT 6-24 HRS): SARS Coronavirus 2: NEGATIVE

## 2019-07-15 NOTE — Progress Notes (Signed)
No answer went into voice mail ask to call [facility]

## 2019-07-15 NOTE — Progress Notes (Signed)
Patient called back. Instructions given. NPO after MN. Arrival time 0800.

## 2019-07-17 ENCOUNTER — Encounter (HOSPITAL_BASED_OUTPATIENT_CLINIC_OR_DEPARTMENT_OTHER)
Admission: RE | Disposition: A | Discharge status: Home / Self Care | Payer: Self-pay | Source: Home / Self Care | Attending: Urology

## 2019-07-17 ENCOUNTER — Other Ambulatory Visit: Payer: Self-pay

## 2019-07-17 ENCOUNTER — Ambulatory Visit (HOSPITAL_COMMUNITY): Payer: BC Managed Care – PPO

## 2019-07-17 ENCOUNTER — Encounter (HOSPITAL_BASED_OUTPATIENT_CLINIC_OR_DEPARTMENT_OTHER): Payer: Self-pay | Admitting: Urology

## 2019-07-17 ENCOUNTER — Ambulatory Visit (HOSPITAL_BASED_OUTPATIENT_CLINIC_OR_DEPARTMENT_OTHER)
Admission: RE | Admit: 2019-07-17 | Discharge: 2019-07-17 | Disposition: A | Discharge status: Home / Self Care | Payer: BC Managed Care – PPO | Attending: Urology | Admitting: Urology

## 2019-07-17 DIAGNOSIS — I10 Essential (primary) hypertension: Secondary | ICD-10-CM | POA: Insufficient documentation

## 2019-07-17 DIAGNOSIS — Z888 Allergy status to other drugs, medicaments and biological substances status: Secondary | ICD-10-CM | POA: Diagnosis not present

## 2019-07-17 DIAGNOSIS — N201 Calculus of ureter: Secondary | ICD-10-CM

## 2019-07-17 DIAGNOSIS — N200 Calculus of kidney: Secondary | ICD-10-CM | POA: Diagnosis not present

## 2019-07-17 DIAGNOSIS — K8020 Calculus of gallbladder without cholecystitis without obstruction: Secondary | ICD-10-CM | POA: Insufficient documentation

## 2019-07-17 DIAGNOSIS — Z87891 Personal history of nicotine dependence: Secondary | ICD-10-CM | POA: Diagnosis not present

## 2019-07-17 DIAGNOSIS — Z79899 Other long term (current) drug therapy: Secondary | ICD-10-CM | POA: Diagnosis not present

## 2019-07-17 DIAGNOSIS — N132 Hydronephrosis with renal and ureteral calculous obstruction: Secondary | ICD-10-CM | POA: Insufficient documentation

## 2019-07-17 DIAGNOSIS — Z7984 Long term (current) use of oral hypoglycemic drugs: Secondary | ICD-10-CM | POA: Insufficient documentation

## 2019-07-17 DIAGNOSIS — Z7982 Long term (current) use of aspirin: Secondary | ICD-10-CM | POA: Diagnosis not present

## 2019-07-17 DIAGNOSIS — E119 Type 2 diabetes mellitus without complications: Secondary | ICD-10-CM | POA: Insufficient documentation

## 2019-07-17 HISTORY — PX: EXTRACORPOREAL SHOCK WAVE LITHOTRIPSY: SHX1557

## 2019-07-17 LAB — GLUCOSE, CAPILLARY: Glucose-Capillary: 128 mg/dL — ABNORMAL HIGH (ref 70–99)

## 2019-07-17 SURGERY — LITHOTRIPSY, ESWL
Anesthesia: LOCAL | Laterality: Left

## 2019-07-17 MED ORDER — DIPHENHYDRAMINE HCL 25 MG PO CAPS
ORAL_CAPSULE | ORAL | Status: AC
  Filled 2019-07-17: qty 1

## 2019-07-17 MED ORDER — OXYCODONE-ACETAMINOPHEN 5-325 MG PO TABS
1.0000 | ORAL_TABLET | Freq: Once | ORAL | Status: AC
  Administered 2019-07-17: 1 via ORAL

## 2019-07-17 MED ORDER — DIAZEPAM 5 MG PO TABS
10.0000 mg | ORAL_TABLET | ORAL | Status: AC
  Administered 2019-07-17: 10 mg via ORAL

## 2019-07-17 MED ORDER — OXYCODONE-ACETAMINOPHEN 5-325 MG PO TABS
ORAL_TABLET | ORAL | Status: AC
  Filled 2019-07-17: qty 1

## 2019-07-17 MED ORDER — SODIUM CHLORIDE 0.9 % IV SOLN: INTRAVENOUS | Status: DC

## 2019-07-17 MED ORDER — DIAZEPAM 5 MG PO TABS
ORAL_TABLET | ORAL | Status: AC
  Filled 2019-07-17: qty 2

## 2019-07-17 MED ORDER — CIPROFLOXACIN HCL 500 MG PO TABS
ORAL_TABLET | ORAL | Status: AC
  Filled 2019-07-17: qty 1

## 2019-07-17 MED ORDER — CIPROFLOXACIN HCL 500 MG PO TABS
500.0000 mg | ORAL_TABLET | ORAL | Status: AC
  Administered 2019-07-17: 500 mg via ORAL

## 2019-07-17 MED ORDER — DIPHENHYDRAMINE HCL 25 MG PO CAPS
25.0000 mg | ORAL_CAPSULE | ORAL | Status: AC
  Administered 2019-07-17: 25 mg via ORAL

## 2019-07-17 MED ORDER — HYDROCODONE-ACETAMINOPHEN 5-325 MG PO TABS: 1.0000 | ORAL_TABLET | ORAL | 0 refills | Status: DC | PRN

## 2019-07-17 NOTE — H&P (Signed)
PRE-OP H&P  Office Visit Report     07/14/2019   --------------------------------------------------------------------------------   Greg Johnston  MRN: [id number]  DOB: [birth year], 59 year old Male    PRIMARY CARE:  Annie Main A. Forde Dandy, MD  REFERRING:    PROVIDER:  Ellison Hughs, M.D.  LOCATION:  [facility] [phone]     --------------------------------------------------------------------------------   CC: I have pain in the flank.  HPI: Greg Johnston is a 60 year-old male patient who is here for flank pain.  The problem is on the left side. His pain started about 07/11/2019. The pain is sharp. The pain is intermittent. The pain does radiate.   None< makes the pain better. Nothing causes the pain to become worse. He was treated with the following pain medication(s): Percocet.   He has not had this same pain previously. He has not had kidney stones.   The patient was seen in the emergency department on 07/11/2019. CT stone study at that time revealed a 9 mm proximal left ureteral calculus with mild-to-moderate hydronephrosis.   Labs 07/11/2019:  Serum creatinine -1.30  WBC-11.6  Urinalysis-noted to have microscopic hematuria, but no overt signs of a UTI   Current, the patient having intermittent episodes of left-sided flank pain that is modestly controlled with Percocet and tamsulosin. He is having some nausea, but no vomiting. He has full cold sweats with pain episodes, but denies fever/chills. AFVSS in the office today.     ALLERGIES: Lisinopril    MEDICATIONS: Allopurinol 100 mg tablet  Aspirin 325 mg tablet  Tamsulosin Hcl 0.4 mg capsule  Amlodipine Besylate 5 mg tablet  Losartan Potassium 25 mg tablet  Metformin Er Gastric 500 mg tablet, er gastric retention 24 hr  Oxycodone-Acetaminophen 5 mg-325 mg tablet  Zofran 4 mg tablet     GU PSH: Hernia Repair W/mesh     NON-GU PSH: None   GU PMH: Chronic Kidney Disease    NON-GU PMH: Diabetes Type  2 Hypertension    FAMILY HISTORY: 2 daughters - No Family History Kidney Stones - Brother   SOCIAL HISTORY: Marital Status: Married Race: White Current Smoking Status: Patient does not smoke anymore.   Tobacco Use Assessment Completed: Used Tobacco in last 30 days? Drinks 1 drink per week.  Drinks 3 caffeinated drinks per day.    REVIEW OF SYSTEMS:    GU Review Male:   Patient denies frequent urination, hard to postpone urination, burning/ pain with urination, get up at night to urinate, leakage of urine, stream starts and stops, trouble starting your stream, have to strain to urinate , erection problems, and penile pain.  Gastrointestinal (Upper):   Patient reports nausea and vomiting. Patient denies indigestion/ heartburn.  Gastrointestinal (Lower):   Patient reports constipation. Patient denies diarrhea.  Constitutional:   Patient reports night sweats. Patient denies fever, weight loss, and fatigue.  Skin:   Patient denies skin rash/ lesion and itching.  Eyes:   Patient denies blurred vision and double vision.  Ears/ Nose/ Throat:   Patient denies sore throat and sinus problems.  Hematologic/Lymphatic:   Patient denies swollen glands and easy bruising.  Cardiovascular:   Patient denies leg swelling and chest pains.  Respiratory:   Patient denies cough and shortness of breath.  Endocrine:   Patient denies excessive thirst.  Musculoskeletal:   Patient reports back pain and joint pain.   Neurological:   Patient denies headaches and dizziness.  Psychologic:   Patient denies depression and anxiety.   VITAL  SIGNS:      07/14/2019 10:29 AM  Weight 248 lb / 112.49 kg  Height 73 in / 185.42 cm  BP 104/76 mmHg  Heart Rate 103 /min  Temperature 97.1 F / 36.1 C  BMI 32.7 kg/m   MULTI-SYSTEM PHYSICAL EXAMINATION:    Constitutional: Well-nourished. No physical deformities. Normally developed. Good grooming.  Neurologic / Psychiatric: Oriented to time, oriented to place, oriented to  person. No depression, no anxiety, no agitation.  Musculoskeletal: Normal gait and station of head and neck.     Complexity of Data:  Records Review:   Previous Hospital Records  X-Ray Review: C.T. Abdomen/Pelvis: Reviewed Films. Reviewed Report. Discussed With Patient.    Notes:                     CLINICAL DATA: Left-sided flank pain for several days     EXAM:  CT ABDOMEN AND PELVIS WITHOUT CONTRAST     TECHNIQUE:  Multidetector CT imaging of the abdomen and pelvis was performed  following the standard protocol without IV contrast.     COMPARISON: 10/12/2015     FINDINGS:  Lower chest: Mild bibasilar atelectasis is noted slightly worse on  the left than the right. No sizable is seen.     Hepatobiliary: Gallbladder is well distended with multiple  gallstones. No obstructive changes are seen. The liver is within  normal limits.     Pancreas: Unremarkable. No pancreatic ductal dilatation or  surrounding inflammatory changes.     Spleen: Normal in size without focal abnormality.     Adrenals/Urinary Tract: Adrenal glands are within normal limits.  Right kidney is within normal limits. Left kidney demonstrates  perinephric stranding and hydronephrosis as well as proximal  hydroureter. This is secondary to a 9 mm proximal left ureteral  stone. The more distal left ureter is within normal limits. Bladder  is decompressed.     Stomach/Bowel: Colon shows no obstructive or inflammatory changes.  The appendix is within normal limits. No small bowel or gastric  abnormality is noted.     Vascular/Lymphatic: Aortic atherosclerosis. No enlarged abdominal or  pelvic lymph nodes.     Reproductive: Prostate is unremarkable.     Other: No abdominal wall hernia or abnormality. No abdominopelvic  ascites.     Musculoskeletal: Degenerative changes of lumbar spine are noted.     IMPRESSION:  9 mm proximal left ureteral stone with hydronephrosis and  hydroureter.     Mild bibasilar  atelectasis is seen.     Cholelithiasis without complicating factors.        Electronically Signed  By: Alcide Clever M.D.  On: 07/11/2019 19:40   PROCEDURES:         KUB - [id number]  A single view of the abdomen is obtained.      Patient confirmed No Neulasta OnPro Device.   There is a 9 mm calcification along the expected course of the left ureter, between L3 and L4. No other calcifications are noted within either renal shadow or along the expected course of the right ureter. No bony or bowel abnormalities noted.         Urinalysis - 81003 Dipstick Dipstick Cont'd  Color: YELLOW Bilirubin: NEGATIVE  Appearance: CLEAR Ketones: 1+  Specific Gravity: 1.020 Blood: 1+  pH: 6.0 Protein: 1+  Glucose: NEGATIVE Urobilinogen: 0.2SE.U./dL    Nitrites: NEGATIVE    Leukocyte Esterase: NEGATIVE    Notes:      ASSESSMENT:  ICD-10 Details  1 GU:   Flank Pain - R10.84 Left, Undiagnosed New Problem  2   Ureteral calculus - N20.1 Left, Undiagnosed New Problem   PLAN:            Medications New Meds: Hydrocodone-Acetaminophen 5 mg-325 mg tablet 1 tablet PO Q 4 H PRN   #30  0 Refill(s)  Tamsulosin Hcl 0.4 mg capsule 1 capsule PO Daily   #30  1 Refill(s)            Orders X-Rays: KUB          Schedule Return Visit/Planned Activity: ASAP - Schedule Surgery          Document Letter(s):  Created for Jeannett Senior A. Evlyn Kanner, MD   Created for Patient: Clinical Summary         Notes:   -The risks, benefits and alternatives of LEFT ESWL was discussed with the patient. I described the risks which include arrhythmia, kidney contusion, kidney hemorrhage, need for transfusion, back discomfort, flank ecchymosis, flank abrasion, inability to break up stone, inability to pass stone fragments, Steinstrasse, infection associated with obstructing stones, need for different surgical procedure and possible need for repeat shockwave lithotripsy. The patient voices understanding and wishes to proceed.    -We discussed criteria to return to clinic or proceed to the ER which include: Fever/chills, worsening pain, nausea/vomiting and/or persistent gross hematuria.

## 2019-07-17 NOTE — Op Note (Signed)
[facility] Operative Note  Treating Physician: Rhoderick Moody, MD  Pre-op diagnosis: 9 mm left mid ureteral stone  Post-op diagnosis: Same   Procedure: LEFT ESWL  See Rojelio Brenner OP note scanned into chart. Also because of the size, density, location and other factors that cannot be anticipated I feel this will likely be a staged procedure. This fact supersedes any indication in the scanned [place] stone operative note to the contrary

## 2019-07-17 NOTE — Discharge Instructions (Signed)
Dietary Guidelines to Help Prevent Kidney Stones Kidney stones are deposits of minerals and salts that form inside your kidneys. Your risk of developing kidney stones may be greater depending on your diet, your lifestyle, the medicines you take, and whether you have certain medical conditions. Most people can reduce their chances of developing kidney stones by following the instructions below. Depending on your overall health and the type of kidney stones you tend to develop, your dietitian may give you more specific instructions. What are tips for following this plan? Reading food labels  Choose foods with "no salt added" or "low-salt" labels. Limit your sodium intake to less than 1500 mg per day.  Choose foods with calcium for each meal and snack. Try to eat about 300 mg of calcium at each meal. Foods that contain 200-500 mg of calcium per serving include: ? 8 oz (237 ml) of milk, fortified nondairy milk, and fortified fruit juice. ? 8 oz (237 ml) of kefir, yogurt, and soy yogurt. ? 4 oz (118 ml) of tofu. ? 1 oz of cheese. ? 1 cup (300 g) of dried figs. ? 1 cup (91 g) of cooked broccoli. ? 1-3 oz can of sardines or mackerel.  Most people need 1000 to 1500 mg of calcium each day. Talk to your dietitian about how much calcium is recommended for you. Shopping  Buy plenty of fresh fruits and vegetables. Most people do not need to avoid fruits and vegetables, even if they contain nutrients that may contribute to kidney stones.  When shopping for convenience foods, choose: ? Whole pieces of fruit. ? Premade salads with dressing on the side. ? Low-fat fruit and yogurt smoothies.  Avoid buying frozen meals or prepared deli foods.  Look for foods with live cultures, such as yogurt and kefir. Cooking  Do not add salt to food when cooking. Place a salt shaker on the table and allow each person to add his or her own salt to taste.  Use vegetable protein, such as beans, textured vegetable  protein (TVP), or tofu instead of meat in pasta, casseroles, and soups. Meal planning   Eat less salt, if told by your dietitian. To do this: ? Avoid eating processed or premade food. ? Avoid eating fast food.  Eat less animal protein, including cheese, meat, poultry, or fish, if told by your dietitian. To do this: ? Limit the number of times you have meat, poultry, fish, or cheese each week. Eat a diet free of meat at least 2 days a week. ? Eat only one serving each day of meat, poultry, fish, or seafood. ? When you prepare animal protein, cut pieces into small portion sizes. For most meat and fish, one serving is about the size of one deck of cards.  Eat at least 5 servings of fresh fruits and vegetables each day. To do this: ? Keep fruits and vegetables on hand for snacks. ? Eat 1 piece of fruit or a handful of berries with breakfast. ? Have a salad and fruit at lunch. ? Have two kinds of vegetables at dinner.  Limit foods that are high in a substance called oxalate. These include: ? Spinach. ? Rhubarb. ? Beets. ? Potato chips and french fries. ? Nuts.  If you regularly take a diuretic medicine, make sure to eat at least 1-2 fruits or vegetables high in potassium each day. These include: ? Avocado. ? Banana. ? Orange, prune, carrot, or tomato juice. ? Baked potato. ? Cabbage. ? Beans and split   peas. General instructions   Drink enough fluid to keep your urine clear or pale yellow. This is the most important thing you can do.  Talk to your health care provider and dietitian about taking daily supplements. Depending on your health and the cause of your kidney stones, you may be advised: ? Not to take supplements with vitamin C. ? To take a calcium supplement. ? To take a daily probiotic supplement. ? To take other supplements such as magnesium, fish oil, or vitamin B6.  Take all medicines and supplements as told by your health care provider.  Limit alcohol intake to no  more than 1 drink a day for nonpregnant women and 2 drinks a day for men. One drink equals 12 oz of beer, 5 oz of wine, or 1 oz of hard liquor.  Lose weight if told by your health care provider. Work with your dietitian to find strategies and an eating plan that works best for you. What foods are not recommended? Limit your intake of the following foods, or as told by your dietitian. Talk to your dietitian about specific foods you should avoid based on the type of kidney stones and your overall health. Grains Breads. Bagels. Rolls. Baked goods. Salted crackers. Cereal. Pasta. Vegetables Spinach. Rhubarb. Beets. Canned vegetables. Pickles. Olives. Meats and other protein foods Nuts. Nut butters. Large portions of meat, poultry, or fish. Salted or cured meats. Deli meats. Hot dogs. Sausages. Dairy Cheese. Beverages Regular soft drinks. Regular vegetable juice. Seasonings and other foods Seasoning blends with salt. Salad dressings. Canned soups. Soy sauce. Ketchup. Barbecue sauce. Canned pasta sauce. Casseroles. Pizza. Lasagna. Frozen meals. Potato chips. French fries. Summary  You can reduce your risk of kidney stones by making changes to your diet.  The most important thing you can do is drink enough fluid. You should drink enough fluid to keep your urine clear or pale yellow.  Ask your health care provider or dietitian how much protein from animal sources you should eat each day, and also how much salt and calcium you should have each day. This information is not intended to replace advice given to you by your health care provider. Make sure you discuss any questions you have with your health care provider. Document Revised: 06/19/2018 Document Reviewed: 02/08/2016 Elsevier Patient Education  2020 [employer]  

## 2019-07-29 DIAGNOSIS — E1165 Type 2 diabetes mellitus with hyperglycemia: Secondary | ICD-10-CM | POA: Diagnosis not present

## 2019-07-29 DIAGNOSIS — E7849 Other hyperlipidemia: Secondary | ICD-10-CM | POA: Diagnosis not present

## 2019-07-29 DIAGNOSIS — E291 Testicular hypofunction: Secondary | ICD-10-CM | POA: Diagnosis not present

## 2019-07-29 DIAGNOSIS — I10 Essential (primary) hypertension: Secondary | ICD-10-CM | POA: Diagnosis not present

## 2019-08-01 DIAGNOSIS — N201 Calculus of ureter: Secondary | ICD-10-CM | POA: Diagnosis not present

## 2019-08-20 DIAGNOSIS — N052 Unspecified nephritic syndrome with diffuse membranous glomerulonephritis: Secondary | ICD-10-CM | POA: Diagnosis not present

## 2019-08-20 DIAGNOSIS — I129 Hypertensive chronic kidney disease with stage 1 through stage 4 chronic kidney disease, or unspecified chronic kidney disease: Secondary | ICD-10-CM | POA: Diagnosis not present

## 2019-08-20 DIAGNOSIS — E559 Vitamin D deficiency, unspecified: Secondary | ICD-10-CM | POA: Diagnosis not present

## 2019-08-21 DIAGNOSIS — N201 Calculus of ureter: Secondary | ICD-10-CM | POA: Diagnosis not present

## 2019-11-26 DIAGNOSIS — E1142 Type 2 diabetes mellitus with diabetic polyneuropathy: Secondary | ICD-10-CM | POA: Diagnosis not present

## 2019-11-26 DIAGNOSIS — E785 Hyperlipidemia, unspecified: Secondary | ICD-10-CM | POA: Diagnosis not present

## 2020-03-09 DIAGNOSIS — D485 Neoplasm of uncertain behavior of skin: Secondary | ICD-10-CM | POA: Diagnosis not present

## 2020-03-09 DIAGNOSIS — L573 Poikiloderma of Civatte: Secondary | ICD-10-CM | POA: Diagnosis not present

## 2020-03-09 DIAGNOSIS — D2271 Melanocytic nevi of right lower limb, including hip: Secondary | ICD-10-CM | POA: Diagnosis not present

## 2020-03-09 DIAGNOSIS — L918 Other hypertrophic disorders of the skin: Secondary | ICD-10-CM | POA: Diagnosis not present

## 2020-03-09 DIAGNOSIS — L820 Inflamed seborrheic keratosis: Secondary | ICD-10-CM | POA: Diagnosis not present

## 2020-03-09 DIAGNOSIS — L738 Other specified follicular disorders: Secondary | ICD-10-CM | POA: Diagnosis not present

## 2020-03-30 DIAGNOSIS — E1142 Type 2 diabetes mellitus with diabetic polyneuropathy: Secondary | ICD-10-CM | POA: Diagnosis not present

## 2023-08-11 ENCOUNTER — Encounter (HOSPITAL_BASED_OUTPATIENT_CLINIC_OR_DEPARTMENT_OTHER): Payer: Self-pay | Admitting: Emergency Medicine

## 2023-08-11 ENCOUNTER — Emergency Department (HOSPITAL_BASED_OUTPATIENT_CLINIC_OR_DEPARTMENT_OTHER)

## 2023-08-11 ENCOUNTER — Other Ambulatory Visit: Payer: Self-pay

## 2023-08-11 ENCOUNTER — Emergency Department (HOSPITAL_BASED_OUTPATIENT_CLINIC_OR_DEPARTMENT_OTHER): Admission: EM | Admit: 2023-08-11 | Discharge: 2023-08-11 | Disposition: A | Discharge status: Home / Self Care

## 2023-08-11 ENCOUNTER — Emergency Department (HOSPITAL_BASED_OUTPATIENT_CLINIC_OR_DEPARTMENT_OTHER): Admitting: Radiology

## 2023-08-11 DIAGNOSIS — S29012A Strain of muscle and tendon of back wall of thorax, initial encounter: Secondary | ICD-10-CM | POA: Diagnosis not present

## 2023-08-11 DIAGNOSIS — I10 Essential (primary) hypertension: Secondary | ICD-10-CM | POA: Diagnosis not present

## 2023-08-11 DIAGNOSIS — Z79899 Other long term (current) drug therapy: Secondary | ICD-10-CM | POA: Insufficient documentation

## 2023-08-11 DIAGNOSIS — S29001A Unspecified injury of muscle and tendon of front wall of thorax, initial encounter: Secondary | ICD-10-CM | POA: Diagnosis present

## 2023-08-11 DIAGNOSIS — E119 Type 2 diabetes mellitus without complications: Secondary | ICD-10-CM | POA: Insufficient documentation

## 2023-08-11 DIAGNOSIS — Z7984 Long term (current) use of oral hypoglycemic drugs: Secondary | ICD-10-CM | POA: Insufficient documentation

## 2023-08-11 DIAGNOSIS — S20212A Contusion of left front wall of thorax, initial encounter: Secondary | ICD-10-CM | POA: Diagnosis not present

## 2023-08-11 DIAGNOSIS — S39012A Strain of muscle, fascia and tendon of lower back, initial encounter: Secondary | ICD-10-CM | POA: Diagnosis not present

## 2023-08-11 DIAGNOSIS — W28XXXA Contact with powered lawn mower, initial encounter: Secondary | ICD-10-CM | POA: Insufficient documentation

## 2023-08-11 DIAGNOSIS — S20219A Contusion of unspecified front wall of thorax, initial encounter: Secondary | ICD-10-CM

## 2023-08-11 MED ORDER — OXYCODONE-ACETAMINOPHEN 5-325 MG PO TABS: 1.0000 | ORAL_TABLET | Freq: Four times a day (QID) | ORAL | 0 refills | Status: AC | PRN

## 2023-08-11 MED ORDER — OXYCODONE-ACETAMINOPHEN 5-325 MG PO TABS
1.0000 | ORAL_TABLET | Freq: Once | ORAL | Status: AC
  Administered 2023-08-11: 1 via ORAL
  Filled 2023-08-11: qty 1

## 2023-08-11 NOTE — ED Provider Notes (Signed)
 [place] EMERGENCY DEPARTMENT AT [facility] Provider Note   CSN: [id number] Arrival date & time: 08/11/23  1310     History  Chief Complaint  Patient presents with   lawnmower turned over    Greg Johnston is a 63 y.o. male.  63 year old male with past medical history of diabetes, hypertension, and hyperlipidemia presenting to the emergency department today with lower thoracic and upper lumbar pain as well as pain over his left chest wall after he was in a lawnmower accident today.  The patient states that he was mowing on a side hill when his lawnmower turned over on him.  He states that this happened 3 to 4 hours prior to arrival.  He did not hit his head or lose consciousness.  Denies any neck pain with this.  He states he is having pain mostly in the lower thoracic and upper lumbar region.  He has been able to ambulate since then.  Denies any bowel or bladder dysfunction or saddle anesthesia.  He denies any focal weakness, numbness, or tingling.  He states that he is having some mild chest discomfort over his lateral chest wall with movement or deep breathing.  Denies any shortness of breath.        Home Medications Prior to Admission medications   Medication Sig Start Date End Date Taking? Authorizing Provider  oxyCODONE -acetaminophen  (PERCOCET/ROXICET) 5-325 MG tablet Take 1 tablet by mouth every 6 (six) hours as needed for severe pain (pain score 7-10). 08/11/23  Yes Carin Charleston, MD  allopurinol (ZYLOPRIM) 100 MG tablet Take 100 mg by mouth daily. 03/04/19   [provider]  Alpha-Lipoic Acid 100 MG TABS Take 1 tablet by mouth daily.    [provider]  amLODipine (NORVASC) 5 MG tablet Take 5 mg by mouth daily. 05/17/19   [provider]  losartan (COZAAR) 25 MG tablet Take 25 mg by mouth daily. 06/21/19   [provider]  meclizine  (ANTIVERT ) 25 MG tablet Take 1 tablet (25 mg total) by mouth 3 (three) times daily as needed for  dizziness. Patient not taking: Reported on 07/11/2019 05/22/18   Ballard Bongo, MD  metFORMIN (GLUCOPHAGE) 500 MG tablet Take 500 mg by mouth 2 (two) times daily. 06/09/19   [provider]  ondansetron  (ZOFRAN  ODT) 4 MG disintegrating tablet Take 1 tablet (4 mg total) by mouth every 8 (eight) hours as needed for nausea or vomiting. 07/11/19   Valla Gauss, PA-C  predniSONE  (DELTASONE ) 20 MG tablet 3 tabs po daily x 3 days, then 2 tabs x 3 days, then 1.5 tabs x 3 days, then 1 tab x 3 days, then 0.5 tabs x 3 days 05/22/18   Ballard Bongo, MD  tadalafil (CIALIS) 5 MG tablet Take 5 mg by mouth daily as needed for erectile dysfunction.  06/19/19   [provider]  tamsulosin (FLOMAX) 0.4 MG CAPS capsule Take 0.4 mg by mouth daily. 07/10/19   [provider]  testosterone  (ANDROGEL ) 50 MG/5GM (1%) GEL Place 5-10 g onto the skin every 14 (fourteen) days.  04/19/15   [provider]      Allergies    Lisinopril    Review of Systems   Review of Systems  Cardiovascular:  Positive for chest pain.  Musculoskeletal:  Positive for back pain.  All other systems reviewed and are negative.   Physical Exam Updated Vital Signs BP 128/76   Pulse 78   Temp 98.7 F (37.1 C) (  Oral)   Resp 15   Ht 6\' 2"  (1.88 m)   Wt 113.4 kg   SpO2 95%   BMI 32.10 kg/m  Physical Exam Vitals and nursing note reviewed.   Gen: NAD Eyes: PERRL, EOMI HEENT: no oropharyngeal swelling Neck: trachea midline, no cervical spine tenderness, no stepoffs or deformities Resp: clear to auscultation bilaterally, tender over the left chest wall with no crepitus noted Card: RRR, no murmurs, rubs, or gallops Abd: nontender, nondistended, no seatbelt sign Extremities: no calf tenderness, no edema MSK: + thoracic spinal tenderness, + lumbar spinal tenderness, no step-offs or deformities Vascular: 2+ radial pulses bilaterally, 2+ DP pulses bilaterally Neuro: Alert and oriented x 3,  equal strength sensation throughout bilateral upper and lower extremities Skin: no rashes   ED Results / Procedures / Treatments   Labs (all labs ordered are listed, but only abnormal results are displayed) Labs Reviewed - No data to display  EKG None  Radiology DG Thoracic Spine 2 View Result Date: 08/11/2023 CLINICAL DATA:  Back pain EXAM: THORACIC SPINE 2 VIEWS COMPARISON:  None Available. FINDINGS: Twelve rib-bearing thoracic type vertebral bodies. The cervicothoracic junction is aligned. Normal alignment with expected lumbar lordosis. Vertebral body heights are well maintained without acute fracture. A couple of midthoracic discs demonstrate mild height loss. Multilevel osteophyte formation noted. The soft tissues are otherwise unremarkable. IMPRESSION: No acute fracture or malalignment of the lumbar spine. Electronically Signed   By: Rance Burrows M.D.   On: 08/11/2023 15:48   DG Chest Portable 1 View Result Date: 08/11/2023 CLINICAL DATA:  Trauma , back pain EXAM: PORTABLE CHEST - 1 VIEW COMPARISON:  June 14, 2016 FINDINGS: Low lung volumes with bronchovascular crowding. Streaky bibasilar atelectasis. No focal airspace consolidation, pleural effusion, or pneumothorax. The cardiac silhouette is at the upper limits of normal, likely accentuated by AP technique and low lung volumes. No acute fracture or destructive lesion. IMPRESSION: No acute cardiopulmonary abnormality. Electronically Signed   By: Rance Burrows M.D.   On: 08/11/2023 15:44   DG Lumbar Spine Complete Result Date: 08/11/2023 CLINICAL DATA:  Back pain EXAM: LUMBAR SPINE - COMPLETE 4+ VIEW COMPARISON:  None Available. FINDINGS: Five non rib-bearing lumbar type vertebral bodies. Mild straightening of the normal lumbar lordosis. Vertebral body heights are well maintained without acute fracture. Intervertebral disc height loss throughout the lumbar spine, most severe at L2-L3 and L5-S1 with large osteophyte formation. Lower  lumbar facet arthropathy. Likely significant neuroforaminal stenosis at L5-S1 and probably at L4-L5. Diffuse aortic atherosclerosis. IMPRESSION: 1. No acute fracture or malalignment of the lumbar spine. 2. Multilevel degenerative disc disease of the lumbar spine with likely significant neural foraminal stenosis at L4-L5 and L5-S1. Correlation for myelopathic symptoms recommended. Electronically Signed   By: Rance Burrows M.D.   On: 08/11/2023 15:43    Procedures Procedures    Medications Ordered in ED Medications  oxyCODONE -acetaminophen  (PERCOCET/ROXICET) 5-325 MG per tablet 1 tablet (1 tablet Oral Given 08/11/23 1444)    ED Course/ Medical Decision Making/ A&P                                 Medical Decision Making 63 year old male with past medical history of diabetes, hypertension, and hyperlipidemia presenting to the emergency department today with chest wall pain as well as back pain after he was in a lawnmower accident earlier today.  I will further evaluate the patient here with a chest x-ray  to evaluate for pneumothorax.  Will obtain x-rays of his thoracic and lumbar spine.  The patient does not have any focal neurological deficits here on exam and has been ambulatory now for the past 3 hours since this occurred.  He denies any abdominal pain and does not have any abdominal tenderness here.  I do not think that he needs abdominal imaging or head imaging at this time but no reports of head injury.  The patient's x-rays are unremarkable.  They did show some spinal stenosis which the patient is aware of.  On reassessment the patient's pulse ox is 92 to 94% on my assessment in the room.  I think that he is stable for discharge.  He remains neurovascular intact.  Is discharged with return precautions.  Amount and/or Complexity of Data Reviewed Radiology: ordered.  Risk Prescription drug management.           Final Clinical Impression(s) / ED Diagnoses Final diagnoses:  Back  strain, initial encounter  Contusion of chest wall, unspecified laterality, initial encounter    Rx / DC Orders ED Discharge Orders          Ordered    oxyCODONE -acetaminophen  (PERCOCET/ROXICET) 5-325 MG tablet  Every 6 hours PRN        08/11/23 1603              Carin Charleston, MD 08/11/23 [phone]

## 2023-08-11 NOTE — Discharge Instructions (Signed)
 Your x-rays did not show any acute findings.  It did show the spinal stenosis that you are aware of.  Please take ibuprofen  at home as needed for pain.  If you are still having pain is okay to take the Percocet.  I do not see any obvious rib fractures but this possible that you may have some small rib fractures we are not seeing on x-ray.  Return to the emergency department for worsening symptoms as we discussed.

## 2023-08-11 NOTE — ED Triage Notes (Signed)
 Pt was on riding lawn mower and turned over and landed on top of him. Today  Back /chest pain.   Med hx.htn, dm.,hyperchol,

## 2023-08-11 NOTE — ED Notes (Addendum)
 Pt transported to [facility]

## 2023-08-11 NOTE — ED Notes (Signed)
 Discharge paperwork given and verbally understood.

## 2023-08-11 NOTE — ED Notes (Signed)
 ED Provider at bedside.

## 2023-08-11 NOTE — ED Notes (Addendum)
 Pt ambulated with Pulse ox... SpO2 93% RA... Provider informed and cleared to DC... No SOB, CP, N/V.Aaron AasAaron Aas
